# Patient Record
Sex: Male | Born: 1960 | Race: Asian | Hispanic: No | Marital: Single | Smoking: Never smoker
Health system: Southern US, Community
[De-identification: ages and names within clinical notes are randomized; demographics above are authoritative.]

---

## 2007-03-01 ENCOUNTER — Emergency Department (HOSPITAL_COMMUNITY): Admission: EM | Admit: 2007-03-01 | Discharge: 2007-03-01 | Payer: Self-pay | Admitting: Emergency Medicine

## 2008-06-08 ENCOUNTER — Ambulatory Visit (HOSPITAL_COMMUNITY): Admission: RE | Admit: 2008-06-08 | Discharge: 2008-06-08 | Payer: Self-pay | Admitting: Neurology

## 2008-09-19 ENCOUNTER — Ambulatory Visit (HOSPITAL_COMMUNITY): Admission: RE | Admit: 2008-09-19 | Discharge: 2008-09-19 | Payer: Self-pay | Admitting: Orthopedic Surgery

## 2009-03-19 ENCOUNTER — Emergency Department (HOSPITAL_COMMUNITY): Admission: EM | Admit: 2009-03-19 | Discharge: 2009-03-19 | Payer: Self-pay | Admitting: Emergency Medicine

## 2010-06-20 ENCOUNTER — Ambulatory Visit (HOSPITAL_COMMUNITY): Admission: RE | Admit: 2010-06-20 | Discharge: 2010-06-20 | Payer: Self-pay | Admitting: Neurology

## 2010-11-28 ENCOUNTER — Inpatient Hospital Stay (INDEPENDENT_AMBULATORY_CARE_PROVIDER_SITE_OTHER)
Admission: RE | Admit: 2010-11-28 | Discharge: 2010-11-28 | Disposition: A | Discharge status: Home / Self Care | Payer: Self-pay | Source: Ambulatory Visit | Attending: Emergency Medicine | Admitting: Emergency Medicine

## 2010-11-28 DIAGNOSIS — J309 Allergic rhinitis, unspecified: Secondary | ICD-10-CM

## 2010-11-28 DIAGNOSIS — H109 Unspecified conjunctivitis: Secondary | ICD-10-CM

## 2011-12-11 ENCOUNTER — Emergency Department (HOSPITAL_COMMUNITY)
Admission: EM | Admit: 2011-12-11 | Discharge: 2011-12-11 | Disposition: A | Discharge status: Home / Self Care | Payer: 59 | Source: Home / Self Care | Attending: Emergency Medicine | Admitting: Emergency Medicine

## 2011-12-11 ENCOUNTER — Encounter (HOSPITAL_COMMUNITY): Payer: Self-pay

## 2011-12-11 DIAGNOSIS — J309 Allergic rhinitis, unspecified: Secondary | ICD-10-CM

## 2011-12-11 DIAGNOSIS — I10 Essential (primary) hypertension: Secondary | ICD-10-CM

## 2011-12-11 DIAGNOSIS — J019 Acute sinusitis, unspecified: Secondary | ICD-10-CM

## 2011-12-11 MED ORDER — OLOPATADINE HCL 0.2 % OP SOLN: 1.0000 [drp] | Freq: Every day | OPHTHALMIC | Status: AC

## 2011-12-11 MED ORDER — METHYLPREDNISOLONE ACETATE 80 MG/ML IJ SUSP
INTRAMUSCULAR | Status: AC
  Filled 2011-12-11: qty 1

## 2011-12-11 MED ORDER — FEXOFENADINE HCL 180 MG PO TABS: 180.0000 mg | ORAL_TABLET | Freq: Every day | ORAL | Status: AC

## 2011-12-11 MED ORDER — PREDNISONE 5 MG PO KIT: 1.0000 | PACK | Freq: Every day | ORAL | Status: DC

## 2011-12-11 MED ORDER — FLUTICASONE PROPIONATE 50 MCG/ACT NA SUSP: 2.0000 | Freq: Every day | NASAL | Status: DC

## 2011-12-11 MED ORDER — METHYLPREDNISOLONE ACETATE 80 MG/ML IJ SUSP
80.0000 mg | Freq: Once | INTRAMUSCULAR | Status: AC
  Administered 2011-12-11: 80 mg via INTRAMUSCULAR

## 2011-12-11 MED ORDER — AMOXICILLIN-POT CLAVULANATE 875-125 MG PO TABS: 1.0000 | ORAL_TABLET | Freq: Two times a day (BID) | ORAL | Status: AC

## 2011-12-11 NOTE — ED Notes (Signed)
Reports he has been having pain and pressure facial area for couple of days; green tinted nasal secretions; had a rx for antibiotic from his MD in [place] written earlier this year, but did not start it until 2-3 days ago for his sympt that were not relieved by OTC medications

## 2011-12-11 NOTE — ED Provider Notes (Signed)
Chief Complaint  Patient presents with  . 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 This going on for years, ever since he came to [place]. It tends to occur in the spring time. He describes nasal congestion, drainage, sneezing, yellowish, bloody drainage, headache, sinus pressure, and postnasal drip. He has some fullness in his ears and low-grade fever of 99.5. His eyes have been red, itching, and watering. He's tried Claritin, Benadryl, amoxicillin without relief.  Review of Systems:  Other than noted above, the patient denies any of the following symptoms. Systemic:  No fever, chills, sweats, fatigue, myalgias, headache, or anorexia. Eye:  No redness, itching, watering, pain or drainage. ENT:  No earache, ear congestion, nasal congestion, sneezing, nasal itching, rhinorrhea, sinus pressure, sinus pain, post nasal drip, or sore throat. Lungs:  No cough, sputum production, wheezing, shortness of breath, or chest pain. Skin:  No rash or itching.  PMFSH:  Past medical history, family history, social history, meds, and allergies were reviewed.  No history of asthma. No use of tobacco.  Physical Exam:   Vital signs:  BP 164/98  Pulse 84  Temp(Src) 97.7 F (36.5 C) (Oral)  Resp 18  SpO2 100% General:  Alert, in no distress. Eye:  No conjunctival injection or drainage. Lids were normal. ENT:  TMs and canals were normal, without erythema or inflammation.  Nasal mucosa was congested, pale and boggy with clear drainage.  Mucous membranes were moist.  Pharynx was clear, without exudate or drainage.  There were no oral ulcerations or lesions. Neck:  Supple, no adenopathy, tenderness or mass. Lungs:  No respiratory distress.  Lungs were clear to auscultation, without wheezes, rales or rhonchi.  Breath sounds were clear and equal bilaterally. Heart:  Regular rhythm, without gallops, murmers or rubs. Skin:   Clear, warm, and dry, without rash or lesions.  Assessment:  The primary encounter diagnosis was Allergic rhinitis. Diagnoses of Acute sinusitis and Essential hypertension were also pertinent to this visit.  Plan:   1.  The following meds were prescribed:   New Prescriptions   AMOXICILLIN-CLAVULANATE (AUGMENTIN) 875-125 MG PER TABLET    Take 1 tablet by mouth 2 (two) times daily.   FEXOFENADINE (ALLEGRA) 180 MG TABLET    Take 1 tablet (180 mg total) by mouth daily.   FLUTICASONE (FLONASE) 50 MCG/ACT NASAL SPRAY    Place 2 sprays into the nose daily.   OLOPATADINE HCL (PATADAY) 0.2 % SOLN    Apply 1 drop to eye daily.   PREDNISONE 5 MG KIT    Take 1 kit (5 mg total) by mouth daily after breakfast. Prednisone 5 mg 6 day dosepack.  Take as directed.   2.  The patient was instructed in symptomatic care and handouts were given. 3.  The patient was told to return if becoming worse in any way, if no better in 3 or 4 days, and given some red flag symptoms that would indicate earlier return.  Follow up:  The patient was told to follow up Dr. Patrice Paradise within the next 2 weeks.    Reuben Likes, MD 12/11/11 2221

## 2011-12-11 NOTE — ED Notes (Signed)
Review of last [facility]?ED visit , bp was also elevated

## 2011-12-11 NOTE — Discharge Instructions (Signed)
Your blood pressure was elevated today.  Be sure to follow up on this within 2 weeks.  Read material below to find out some ways to control your blood pressure.   Blood pressure over the ideal can put you at higher risk for stroke, heart disease, and kidney failure.  For this reason, it's important to try to get your blood pressure as close as possible to the ideal.  The ideal blood pressure is 120/80.  Blood pressures from 120-139 systolic over 80-89 diastolic are labeled as "prehypertension."  This means you are at higher risk of developing hypertension in the future.  Blood pressures in this range are not treated with medication, but lifestyle changes are recommended to prevent progression to hypertension.  Blood pressures of 140 and above systolic over 90 and above diastolic are classified as hypertension and are treated with medications.  Lifestyle changes which can benefit both prehypertension and hypertension include the following:   Salt and sodium restriction.  Weight loss.  Regular exercise.  Avoidance of tobacco.  Avoidance of excess alcohol.  The "D.A.S.H" diet.   People with hypertension and prehypertension should limit their salt intake to less than 1500 mg daily.  Reading the nutrition information on the label of many prepared foods can give you an idea of how much sodium you're consuming at each meal.  Remember that the most important number on the nutrition information is the serving size.  It may be smaller than you think.  Try to avoid adding extra salt at the table.  You may add small amounts of salt while cooking.  Remember that salt is an acquired taste and you may get used to a using a whole lot less salt than you are using now.  Using less salt lets the food's natural flavors come through.  You might want to consider using salt substitutes, potassium chloride, pepper, or blends of herbs and spices to enhance the flavor of your food.  Foods that contain the most salt  include: processed meats (like ham, bacon, lunch meat, sausage, hot dogs, and breakfast meat), chips, pretzels, salted nuts, soups, salty snacks, canned foods, junk food, fast food, restaurant food, mustard, pickles, pizza, popcorn, soy sauce, and worcestershire sauce--quite a list!  You might ask, "Is there anything I can eat?"  The answer is, "yes."  Fruits and vegetables are usually low in salt.  Fresh is better than frozen which is better than canned.  If you have canned vegetables, you can cut down on the salt content by rinsing them in tap water 3 times before cooking.     Weight loss is the second thing you can do to lower your blood pressure.  Getting to and maintaining ideal weight will often normalize your blood pressure and allow you to avoid medications, entirely, cut way down on your dosage of medications, or allow to wean off your meds.  (Note, this should only be done under the supervision of your primary care doctor.)  Of course, weight loss takes time and you may need to be on medication in the meantime.  You shoot for a body mass index of 20-25.  When you go to the urgent care or to your primary care doctor, they should calculate your BMI.  If you don't know what it is, ask.  You can calculate your BMI with the following formula:  Weight in pounds x 703/ (height in inches) x (height in inches).  There are many good diets out there: Weight Watchers and  the D.A.S.H. Diet are the best, but often, just modifying a few factors can be helpful:  Don't skip meals, don't eat out, and keeping a food diary.  I do not recommend fad diets or diet pills which often raise blood pressure.    Everyone should get regular exercise, but this is particularly important for people with high blood pressure.  Just about any exercise is good.  The only exercise which may be harmful is lifting extreme heavy weights.  I recommend moderate exercise such as walking for 30 minutes 5 days a week.  Going to the gym for a 50  minute workout 3 times a week is also good.  This amounts to 150 minutes of exercise weekly.   Anyone with high blood pressure should avoid any use of tobacco.  Tobacco use does not elevate blood pressure, but it increases the risk of heart disease and stroke.  If you are interested in quitting, discuss with your doctor how to quit.  If you are not interested in quitting, ask yourself, "What would my life be like in 10 years if I continue to smoke?"  "How will I know when it is time to quit?"  "How would my life be better if I were to quit."   Excess alcohol intake can raise the blood pressure.  The safe alcohol intake is 2 drinks or less per day for men and 1 drink per day or less for women.   There is a very good diet which I recommend that has been designed for people with blood pressure called the D.A.S.H. Diet (dietary approaches to stop hypertension).  It consists of fruits, vegetables, lean meats, low fat dairy, whole grains, nuts and seeds.  It is very low in salt and sodium.  It has also been found to have other beneficial health effects such as lowering cholesterol and helping lose weight.  It has been developed by the [employer] and can be downloaded from the internet without any cost. Just do a Programmer, multimedia on "D.A.S.H. Diet." or go the NIH website (GolfingPosters.tn).  There are also cookbooks and diet plans that can be gotten from [place] to help you with this diet.   Allergic Rhinitis Allergic rhinitis is when the mucous membranes in the nose respond to allergens. Allergens are particles in the air that cause your body to have an allergic reaction. This causes you to release allergic antibodies. Through a chain of events, these eventually cause you to release histamine into the blood stream (hence the use of antihistamines). Although meant to be protective to the body, it is this release that causes your discomfort, such as frequent sneezing, congestion and an itchy runny nose.    CAUSES  The pollen allergens may come from grasses, trees, and weeds. This is seasonal allergic rhinitis, or "hay fever." Other allergens cause year-round allergic rhinitis (perennial allergic rhinitis) such as house dust mite allergen, pet dander and mold spores.  SYMPTOMS   Nasal stuffiness (congestion).   Runny, itchy nose with sneezing and tearing of the eyes.   There is often an itching of the mouth, eyes and ears.  It cannot be cured, but it can be controlled with medications. DIAGNOSIS  If you are unable to determine the offending allergen, skin or blood testing may find it. TREATMENT   Avoid the allergen.   Medications and allergy shots (immunotherapy) can help.   Hay fever may often be treated with antihistamines in pill or nasal spray forms.  Antihistamines block the effects of histamine. There are over-the-counter medicines that may help with nasal congestion and swelling around the eyes. Check with your caregiver before taking or giving this medicine.  If the treatment above does not work, there are many new medications your caregiver can prescribe. Stronger medications may be used if initial measures are ineffective. Desensitizing injections can be used if medications and avoidance fails. Desensitization is when a patient is given ongoing shots until the body becomes less sensitive to the allergen. Make sure you follow up with your caregiver if problems continue. SEEK MEDICAL CARE IF:   You develop fever (more than 100.5 F (38.1 C).   You develop a cough that does not stop easily (persistent).   You have shortness of breath.   You start wheezing.   Symptoms interfere with normal daily activities.  Document Released: 04/28/2001 Document Revised: 07/23/2011 Document Reviewed: 11/07/2008 [facility] Patient Information 2012 [place], [place].Sinusitis Sinuses are air pockets within the bones of your face. The growth of bacteria within a sinus leads to infection. The infection  prevents the sinuses from draining. This infection is called sinusitis. SYMPTOMS  There will be different areas of pain depending on which sinuses have become infected.  The maxillary sinuses often produce pain beneath the eyes.   Frontal sinusitis may cause pain in the middle of the forehead and above the eyes.  Other problems (symptoms) include:  Toothaches.   Colored, pus-like (purulent) drainage from the nose.   Swelling, warmth, and tenderness over the sinus areas may be signs of infection.  TREATMENT  Sinusitis is most often determined by an exam.X-rays may be taken. If x-rays have been taken, make sure you obtain your results or find out how you are to obtain them. Your caregiver may give you medications (antibiotics). These are medications that will help kill the bacteria causing the infection. You may also be given a medication (decongestant) that helps to reduce sinus swelling.  HOME CARE INSTRUCTIONS   Only take over-the-counter or prescription medicines for pain, discomfort, or fever as directed by your caregiver.   Drink extra fluids. Fluids help thin the mucus so your sinuses can drain more easily.   Applying either moist heat or ice packs to the sinus areas may help relieve discomfort.   Use saline nasal sprays to help moisten your sinuses. The sprays can be found at your local drugstore.  SEEK IMMEDIATE MEDICAL CARE IF:  You have a fever.   You have increasing pain, severe headaches, or toothache.   You have nausea, vomiting, or drowsiness.   You develop unusual swelling around the face or trouble seeing.  MAKE SURE YOU:   Understand these instructions.   Will watch your condition.   Will get help right away if you are not doing well or get worse.  Document Released: 08/03/2005 Document Revised: 07/23/2011 Document Reviewed: 03/02/2007 [facility] Patient Information 2012 [place], [place].

## 2014-12-13 ENCOUNTER — Encounter (HOSPITAL_COMMUNITY): Payer: Self-pay | Admitting: Emergency Medicine

## 2014-12-13 ENCOUNTER — Emergency Department (HOSPITAL_COMMUNITY)
Admission: EM | Admit: 2014-12-13 | Discharge: 2014-12-13 | Disposition: A | Discharge status: Home / Self Care | Payer: 59 | Source: Home / Self Care | Attending: Emergency Medicine | Admitting: Emergency Medicine

## 2014-12-13 DIAGNOSIS — K088 Other specified disorders of teeth and supporting structures: Secondary | ICD-10-CM | POA: Diagnosis not present

## 2014-12-13 DIAGNOSIS — K0889 Other specified disorders of teeth and supporting structures: Secondary | ICD-10-CM

## 2014-12-13 MED ORDER — AMOXICILLIN 500 MG PO CAPS: 500.0000 mg | ORAL_CAPSULE | Freq: Three times a day (TID) | ORAL | Status: DC

## 2014-12-13 MED ORDER — IBUPROFEN 800 MG PO TABS: 800.0000 mg | ORAL_TABLET | Freq: Three times a day (TID) | ORAL | Status: DC | PRN

## 2014-12-13 MED ORDER — HYDROCODONE-ACETAMINOPHEN 5-325 MG PO TABS: 1.0000 | ORAL_TABLET | Freq: Four times a day (QID) | ORAL | Status: DC | PRN

## 2014-12-13 NOTE — Discharge Instructions (Signed)
You are likely developing an infection around one of your teeth. Take amoxicillin 1 pill 3 times a day for 10 days. Take ibuprofen 800 mg every 8 hours as needed for pain. Use the Norco every 4-6 hours as needed for severe pain. Do not drive while taking this medicine. Follow-up with a dentist as soon as possible for additional evaluation.

## 2014-12-13 NOTE — ED Provider Notes (Signed)
CSN: [id number]     Arrival date & time 12/13/14  0806 History   None    Chief Complaint  Patient presents with  . Dental Pain   (Consider location/radiation/quality/duration/timing/severity/associated sxs/prior Treatment) HPI  He is a 54 year old man here for evaluation of left lower jaw swelling. He states this started yesterday after eating symptoms chocolate. Developed a left lower toothache. When he woke up this morning he had swelling and tightness of the left lower jaw. He denies any fevers or difficulty swallowing. No difficulty breathing.  History reviewed. No pertinent past medical history. History reviewed. No pertinent past surgical history. History reviewed. No pertinent family history. History  Substance Use Topics  . Smoking status: Never Smoker   . Smokeless tobacco: Not on file  . Alcohol Use: No    Review of Systems As in history of present illness Allergies  Eggs or egg-derived products  Home Medications   Prior to Admission medications   Medication Sig Start Date End Date Taking? Authorizing Provider  amoxicillin (AMOXIL) 500 MG capsule Take 1 capsule (500 mg total) by mouth 3 (three) times daily. 12/13/14   Charm RingsErin J Bashir Marchetti, MD  fexofenadine (ALLEGRA) 180 MG tablet Take 1 tablet (180 mg total) by mouth daily. 12/11/11 12/10/12  Reuben Likesavid C Keller, MD  fluticasone (FLONASE) 50 MCG/ACT nasal spray Place 2 sprays into the nose daily. 12/11/11 12/10/12  Reuben Likesavid C Keller, MD  HYDROcodone-acetaminophen (NORCO) 5-325 MG per tablet Take 1 tablet by mouth every 6 (six) hours as needed for moderate pain. 12/13/14   Charm RingsErin J Jerolene Kupfer, MD  ibuprofen (ADVIL,MOTRIN) 800 MG tablet Take 1 tablet (800 mg total) by mouth every 8 (eight) hours as needed. 12/13/14   Charm RingsErin J Nabilah Davoli, MD  Olopatadine HCl (PATADAY) 0.2 % SOLN Apply 1 drop to eye daily. 12/11/11   Reuben Likesavid C Keller, MD   BP 136/86 mmHg  Pulse 81  Temp(Src) 97.9 F (36.6 C) (Oral)  Resp 16  SpO2 100% Physical Exam  Constitutional: He is  oriented to person, place, and time. He appears well-developed and well-nourished. No distress.  HENT:  Mouth/Throat: Abnormal dentition.  Mild swelling of the left lower jaw. No focal abscess seen in the mouth. He has general poor dentition.  Cardiovascular: Normal rate.   Pulmonary/Chest: Effort normal.  Neurological: He is alert and oriented to person, place, and time.    ED Course  Procedures (including critical care time) Labs Review Labs Reviewed - No data to display  Imaging Review No results found.   MDM   1. Pain, dental    Will treat for infection with amoxicillin. Ibuprofen and Norco for pain. Follow-up with a dentist as soon as possible.    Charm RingsErin J Jaaziel Peatross, MD 12/13/14 [phone]

## 2014-12-13 NOTE — ED Notes (Signed)
Pt states that he was eating some chocolate last night and he tooth on the lower right side of his mouth started hurting he states that his right side of face starting swelling.

## 2016-11-28 ENCOUNTER — Ambulatory Visit (HOSPITAL_COMMUNITY)
Admission: EM | Admit: 2016-11-28 | Discharge: 2016-11-28 | Disposition: A | Discharge status: Home / Self Care | Payer: 59 | Attending: Family Medicine | Admitting: Family Medicine

## 2016-11-28 DIAGNOSIS — H00011 Hordeolum externum right upper eyelid: Secondary | ICD-10-CM | POA: Diagnosis not present

## 2016-11-28 MED ORDER — TOBRAMYCIN 0.3 % OP SOLN: 1.0000 [drp] | OPHTHALMIC | 0 refills | Status: DC

## 2016-11-28 MED ORDER — TOBRAMYCIN 0.3 % OP SOLN: 1.0000 [drp] | OPHTHALMIC | 0 refills | Status: AC

## 2016-11-28 NOTE — ED Provider Notes (Signed)
[facility]    CSN: [id number] Arrival date & time: 11/28/16  1216     History   Chief Complaint Chief Complaint  Patient presents with  . Conjunctivitis    HPI Roberto Harris is a 56 y.o. male.   rt eye poss allergies  Patient has had swelling in his right upper lid overnight. He works as a Engineer, civil (consulting). His taken no medicine for this other than Benadryl.      No past medical history on file.  There are no active problems to display for this patient.   No past surgical history on file.     Home Medications    Prior to Admission medications   Medication Sig Start Date End Date Taking? Authorizing Provider  fexofenadine (ALLEGRA) 180 MG tablet Take 1 tablet (180 mg total) by mouth daily. 12/11/11 12/10/12  Reuben Likes, MD  fluticasone (FLONASE) 50 MCG/ACT nasal spray Place 2 sprays into the nose daily. 12/11/11 12/10/12  Reuben Likes, MD  Olopatadine HCl (PATADAY) 0.2 % SOLN Apply 1 drop to eye daily. 12/11/11   Reuben Likes, MD  tobramycin (TOBREX) 0.3 % ophthalmic solution Place 1 drop into the right eye every 4 (four) hours. 11/28/16   Elvina Sidle, MD    Family History No family history on file.  Social History Social History  Substance Use Topics  . Smoking status: Never Smoker  . Smokeless tobacco: Not on file  . Alcohol use No     Allergies   Eggs or egg-derived products   Review of Systems Review of Systems  Eyes: Positive for pain and redness.  All other systems reviewed and are negative.    Physical Exam Triage Vital Signs ED Triage Vitals [11/28/16 1343]  Enc Vitals Group     BP 125/81     Pulse Rate 76     Resp      Temp 97.9 F (36.6 C)     Temp Source Oral     SpO2 97 %     Weight      Height      Head Circumference      Peak Flow      Pain Score      Pain Loc      Pain Edu?      Excl. in GC?    No data found.   Updated Vital Signs BP 125/81 (BP Location: Right Arm)   Pulse 76   Temp 97.9 F (36.6 C)  (Oral)   SpO2 97%    Physical Exam  Constitutional: He is oriented to person, place, and time. He appears well-developed and well-nourished.  HENT:  Right Ear: External ear normal.  Left Ear: External ear normal.  Mouth/Throat: Oropharynx is clear and moist.  Eyes: Pupils are equal, round, and reactive to light.  Mild injection of the right conjunctiva Mild swelling in the midportion of the upper right lid. This is tender.  Neck: Normal range of motion. Neck supple.  Pulmonary/Chest: Effort normal.  Musculoskeletal: Normal range of motion.  Neurological: He is alert and oriented to person, place, and time.  Skin: Skin is warm and dry. No rash noted. There is erythema.  Nursing note and vitals reviewed.    UC Treatments / Results  Labs (all labs ordered are listed, but only abnormal results are displayed) Labs Reviewed - No data to display  EKG  EKG Interpretation None       Radiology No results found.  Procedures  Procedures (including critical care time)  Medications Ordered in UC Medications - No data to display   Initial Impression / Assessment and Plan / UC Course  I have reviewed the triage vital signs and the nursing notes.  Pertinent labs & imaging results that were available during my care of the patient were reviewed by me and considered in my medical decision making (see chart for details).     Final Clinical Impressions(s) / UC Diagnoses   Final diagnoses:  Hordeolum externum of right upper eyelid    New Prescriptions New Prescriptions   TOBRAMYCIN (TOBREX) 0.3 % OPHTHALMIC SOLUTION    Place 1 drop into the right eye every 4 (four) hours.     Elvina Sidle, MD 11/28/16 [phone]

## 2017-05-21 ENCOUNTER — Encounter (HOSPITAL_COMMUNITY): Payer: Self-pay | Admitting: Emergency Medicine

## 2017-05-21 ENCOUNTER — Ambulatory Visit (HOSPITAL_COMMUNITY)
Admission: EM | Admit: 2017-05-21 | Discharge: 2017-05-21 | Disposition: A | Discharge status: Home / Self Care | Payer: 59 | Attending: Physician Assistant | Admitting: Physician Assistant

## 2017-05-21 DIAGNOSIS — J00 Acute nasopharyngitis [common cold]: Secondary | ICD-10-CM | POA: Diagnosis not present

## 2017-05-21 MED ORDER — CETIRIZINE-PSEUDOEPHEDRINE ER 5-120 MG PO TB12: 1.0000 | ORAL_TABLET | Freq: Every day | ORAL | 0 refills | Status: AC

## 2017-05-21 MED ORDER — BENZONATATE 100 MG PO CAPS: 100.0000 mg | ORAL_CAPSULE | Freq: Three times a day (TID) | ORAL | 0 refills | Status: DC

## 2017-05-21 MED ORDER — FLUTICASONE PROPIONATE 50 MCG/ACT NA SUSP: 2.0000 | Freq: Every day | NASAL | 0 refills | Status: AC

## 2017-05-21 MED FILL — FLUTICASONE PROP 50 MCG SPR: 50 | 30 days supply | Qty: 16 | Fill #0

## 2017-05-21 MED FILL — BENZONATATE 100 MG CAPS: 100 | 7 days supply | Qty: 21 | Fill #0

## 2017-05-21 NOTE — ED Triage Notes (Signed)
Pt c/o cold sx onset 2-3 days associated w/fevers, nasal congestion/drainage, BAs, HAs  Taking OTC Nyquil  A&O x4... NAD... Ambulatory

## 2017-05-21 NOTE — ED Provider Notes (Signed)
[facility]    CSN: [id number] Arrival date & time: 05/21/17  1104     History   Chief Complaint Chief Complaint  Patient presents with  . URI    HPI Roberto Harris is a 56 y.o. male.   56 year old male comes in with 2-3 day history of cold symptoms. States having nasal congestion/drainage, body aches, headaches and subjective fever. Has been taking nyquil/tylenol with little relief. Does state that symptoms seems to be better, but congestion seems to continue. Mild coughing that is nonproductive. Patient state he took claritin when rhinorrhea first started, but has discontinued. Denies ear pain, eye pain. States headache is more like heaviness. Denies chest pain, shortness of breath, wheezing. No sick contact.       History reviewed. No pertinent past medical history.  There are no active problems to display for this patient.   History reviewed. No pertinent surgical history.     Home Medications    Prior to Admission medications   Medication Sig Start Date End Date Taking? Authorizing Provider  benzonatate (TESSALON) 100 MG capsule Take 1 capsule (100 mg total) by mouth every 8 (eight) hours. 05/21/17   Cathie Hoops, Amy V, PA-C  cetirizine-pseudoephedrine (ZYRTEC-D) 5-120 MG tablet Take 1 tablet by mouth daily. 05/21/17   Cathie Hoops, Amy V, PA-C  fexofenadine (ALLEGRA) 180 MG tablet Take 1 tablet (180 mg total) by mouth daily. 12/11/11 12/10/12  Reuben Likes, MD  fluticasone (FLONASE) 50 MCG/ACT nasal spray Place 2 sprays into both nostrils daily. 05/21/17   Cathie Hoops, Amy V, PA-C  Olopatadine HCl (PATADAY) 0.2 % SOLN Apply 1 drop to eye daily. 12/11/11   Reuben Likes, MD  tobramycin (TOBREX) 0.3 % ophthalmic solution Place 1 drop into the right eye every 4 (four) hours. 11/28/16   Elvina Sidle, MD    Family History History reviewed. No pertinent family history.  Social History Social History  Substance Use Topics  . Smoking status: Never Smoker  . Smokeless tobacco: Never  Used  . Alcohol use No     Allergies   Eggs or egg-derived products   Review of Systems Review of Systems  Reason unable to perform ROS: See HPI as above.     Physical Exam Triage Vital Signs ED Triage Vitals  Enc Vitals Group     BP 05/21/17 1133 138/76     Pulse Rate 05/21/17 1133 87     Resp 05/21/17 1133 20     Temp 05/21/17 1133 98 F (36.7 C)     Temp Source 05/21/17 1133 Oral     SpO2 05/21/17 1133 100 %     Weight --      Height --      Head Circumference --      Peak Flow --      Pain Score 05/21/17 1134 4     Pain Loc --      Pain Edu? --      Excl. in GC? --    No data found.   Updated Vital Signs BP 138/76 (BP Location: Left Arm)   Pulse 87   Temp 98 F (36.7 C) (Oral)   Resp 20   SpO2 100%    Physical Exam  Constitutional: He is oriented to person, place, and time. He appears well-developed and well-nourished. No distress.  HENT:  Head: Normocephalic and atraumatic.  Right Ear: External ear and ear canal normal. Tympanic membrane is erythematous. Tympanic membrane is not bulging.  Left Ear: External ear and ear canal normal. Tympanic membrane is erythematous. Tympanic membrane is not bulging.  Nose: Mucosal edema and rhinorrhea present. Right sinus exhibits no maxillary sinus tenderness and no frontal sinus tenderness. Left sinus exhibits no maxillary sinus tenderness and no frontal sinus tenderness.  Mouth/Throat: Uvula is midline, oropharynx is clear and moist and mucous membranes are normal.  Eyes: Pupils are equal, round, and reactive to light. Conjunctivae are normal.  Neck: Normal range of motion. Neck supple.  Cardiovascular: Normal rate, regular rhythm and normal heart sounds.  Exam reveals no gallop and no friction rub.   No murmur heard. Pulmonary/Chest: Effort normal and breath sounds normal. He has no decreased breath sounds. He has no wheezes. He has no rhonchi. He has no rales.  Lymphadenopathy:    He has no cervical adenopathy.    Neurological: He is alert and oriented to person, place, and time.  Skin: Skin is warm and dry.  Psychiatric: He has a normal mood and affect. His behavior is normal. Judgment normal.     UC Treatments / Results  Labs (all labs ordered are listed, but only abnormal results are displayed) Labs Reviewed - No data to display  EKG  EKG Interpretation None       Radiology No results found.  Procedures Procedures (including critical care time)  Medications Ordered in UC Medications - No data to display   Initial Impression / Assessment and Plan / UC Course  I have reviewed the triage vital signs and the nursing notes.  Pertinent labs & imaging results that were available during my care of the patient were reviewed by me and considered in my medical decision making (see chart for details).    Discussed with patient history and exam most consistent with viral URI. Symptomatic treatment as needed. Push fluids. Return precautions given.   Final Clinical Impressions(s) / UC Diagnoses   Final diagnoses:  Acute nasopharyngitis    New Prescriptions New Prescriptions   BENZONATATE (TESSALON) 100 MG CAPSULE    Take 1 capsule (100 mg total) by mouth every 8 (eight) hours.   CETIRIZINE-PSEUDOEPHEDRINE (ZYRTEC-D) 5-120 MG TABLET    Take 1 tablet by mouth daily.   FLUTICASONE (FLONASE) 50 MCG/ACT NASAL SPRAY    Place 2 sprays into both nostrils daily.      Belinda Fisher, PA-C 05/21/17 1228

## 2017-05-21 NOTE — Discharge Instructions (Signed)
Tessalon for cough. Start flonase, zyrtec-D for nasal congestion. You can use over the counter nasal saline rinse such as neti pot for nasal congestion. Keep hydrated, your urine should be clear to pale yellow in color. Tylenol/motrin for fever and pain. Monitor for any worsening of symptoms, chest pain, shortness of breath, wheezing, swelling of the throat, follow up for reevaluation.  ° °

## 2017-10-05 ENCOUNTER — Encounter (HOSPITAL_COMMUNITY): Payer: Self-pay

## 2017-10-05 ENCOUNTER — Ambulatory Visit (HOSPITAL_COMMUNITY)
Admission: EM | Admit: 2017-10-05 | Discharge: 2017-10-05 | Disposition: A | Discharge status: Home / Self Care | Payer: 59 | Attending: Internal Medicine | Admitting: Internal Medicine

## 2017-10-05 DIAGNOSIS — R52 Pain, unspecified: Secondary | ICD-10-CM

## 2017-10-05 DIAGNOSIS — R05 Cough: Secondary | ICD-10-CM

## 2017-10-05 DIAGNOSIS — R69 Illness, unspecified: Principal | ICD-10-CM

## 2017-10-05 DIAGNOSIS — J111 Influenza due to unidentified influenza virus with other respiratory manifestations: Secondary | ICD-10-CM

## 2017-10-05 DIAGNOSIS — R0981 Nasal congestion: Secondary | ICD-10-CM | POA: Diagnosis not present

## 2017-10-05 MED ORDER — OSELTAMIVIR PHOSPHATE 75 MG PO CAPS: 75.0000 mg | ORAL_CAPSULE | Freq: Two times a day (BID) | ORAL | 0 refills | Status: AC

## 2017-10-05 MED ORDER — BENZONATATE 200 MG PO CAPS: 200.0000 mg | ORAL_CAPSULE | Freq: Three times a day (TID) | ORAL | 0 refills | Status: AC | PRN

## 2017-10-05 MED ORDER — PREDNISONE 50 MG PO TABS: 50.0000 mg | ORAL_TABLET | Freq: Every day | ORAL | 0 refills | Status: AC

## 2017-10-05 MED FILL — predniSONE 50 MG TABS: 50 | 3 days supply | Qty: 3 | Fill #0

## 2017-10-05 MED FILL — BENZONATATE 200 MG CAPSULE: 200 | 10 days supply | Qty: 30 | Fill #0

## 2017-10-05 MED FILL — OSELTAMIVIR PHOSPHATE 75 MG: 75 | 5 days supply | Qty: 10 | Fill #0

## 2017-10-05 NOTE — Discharge Instructions (Addendum)
Anticipate gradual improvement in cough, well being, achiness, over the next several days.  Cough may take a couple weeks to subside.  Note for work, return to work 2/21.  Push fluids and rest.  Cover coughs and wash hands frequently.  Prescriptions for oseltamivir (for flu), prednisone for cough, and benzonatate for cough, were sent to the pharmacy.  Recheck for new fever >100.5, increasing phlegm production/nasal discharge, or if not starting to improve in a few days.

## 2017-10-05 NOTE — ED Provider Notes (Signed)
[facility]    CSN: [id number] Arrival date & time: 10/05/17  1019     History   Chief Complaint Chief Complaint  Patient presents with  . Influenza    HPI Roberto Harris is a 57 y.o. male.   He presents today with onset 2 days ago of cough, malaise, widespread achiness.  Little bit of fever, some chills.  Did have a flu shot.  Had loose stool x2 this morning.  Not vomiting.  Little bit of clear rhinorrhea.  No sore throat.  Feels terrible.    HPI  History reviewed. No pertinent past medical history.  No past surgical history on file.     Home Medications    Prior to Admission medications   Medication Sig Start Date End Date Taking? Authorizing Provider  fexofenadine (ALLEGRA) 180 MG tablet Take 1 tablet (180 mg total) by mouth daily. 12/11/11 10/05/17 Yes Reuben LikesKeller, David C, MD  benzonatate (TESSALON) 200 MG capsule Take 1 capsule (200 mg total) by mouth 3 (three) times daily as needed for cough. 10/05/17   Isa RankinMurray, Apolinar Bero Wilson, MD  cetirizine-pseudoephedrine (ZYRTEC-D) 5-120 MG tablet Take 1 tablet by mouth daily. 05/21/17   Cathie HoopsYu, Amy V, PA-C  fluticasone (FLONASE) 50 MCG/ACT nasal spray Place 2 sprays into both nostrils daily. 05/21/17   Cathie HoopsYu, Amy V, PA-C  Olopatadine HCl (PATADAY) 0.2 % SOLN Apply 1 drop to eye daily. 12/11/11   Reuben LikesKeller, David C, MD  oseltamivir (TAMIFLU) 75 MG capsule Take 1 capsule (75 mg total) by mouth every 12 (twelve) hours. 10/05/17   Isa RankinMurray, Kimya Mccahill Wilson, MD  predniSONE (DELTASONE) 50 MG tablet Take 1 tablet (50 mg total) by mouth daily. 10/05/17   Isa RankinMurray, Jordan Pardini Wilson, MD  tobramycin (TOBREX) 0.3 % ophthalmic solution Place 1 drop into the right eye every 4 (four) hours. 11/28/16   Elvina SidleLauenstein, Kurt, MD    Family History No family history on file.  Social History Social History   Tobacco Use  . Smoking status: Never Smoker  . Smokeless tobacco: Never Used  Substance Use Topics  . Alcohol use: No  . Drug use: No     Allergies   Eggs or  egg-derived products   Review of Systems Review of Systems  All other systems reviewed and are negative.    Physical Exam Triage Vital Signs ED Triage Vitals  Enc Vitals Group     BP 10/05/17 1057 (!) 153/88     Pulse Rate 10/05/17 1057 92     Resp 10/05/17 1057 18     Temp 10/05/17 1057 (!) 97.4 F (36.3 C)     Temp Source 10/05/17 1057 Oral     SpO2 10/05/17 1057 99 %     Weight 10/05/17 1102 145 lb (65.8 kg)     Height --      Pain Score --      Pain Loc --    Updated Vital Signs BP (!) 153/88 (BP Location: Left Arm) Comment: Notified [place], pt sts that he has been taking cough meds  Pulse 92   Temp (!) 97.4 F (36.3 C) (Oral)   Resp 18   Wt 145 lb (65.8 kg)   SpO2 99%   Physical Exam  Constitutional: He is oriented to person, place, and time.  Alert, nicely groomed Sitting up on the end of the exam table, looks ill but not toxic  HENT:  Head: Atraumatic.  Bilateral TMs are flushed pink, mildly dull Marked nasal congestion bilaterally Throat  is injected  Eyes:  Conjugate gaze, bilateral conjunctivae mildly injected, no drainage  Neck: Neck supple.  Cardiovascular: Normal rate and regular rhythm.  Pulmonary/Chest: No respiratory distress. He has no wheezes. He has no rales.  Abdominal: He exhibits no distension.  Musculoskeletal: Normal range of motion.  Neurological: He is alert and oriented to person, place, and time.  Skin: Skin is warm and dry.  No cyanosis  Nursing note and vitals reviewed.    UC Treatments / Results   Procedures Procedures (including critical care time) None today  Final Clinical Impressions(s) / UC Diagnoses   Final diagnoses:  Influenza-like illness   Anticipate gradual improvement in cough, well being, achiness, over the next several days.  Cough may take a couple weeks to subside.  Note for work, return to work 2/21.  Push fluids and rest.  Cover coughs and wash hands frequently.  Prescriptions for oseltamivir (for  flu), prednisone for cough, and benzonatate for cough, were sent to the pharmacy.  Recheck for new fever >100.5, increasing phlegm production/nasal discharge, or if not starting to improve in a few days.     ED Discharge Orders        Ordered    oseltamivir (TAMIFLU) 75 MG capsule  Every 12 hours     10/05/17 1140    predniSONE (DELTASONE) 50 MG tablet  Daily     10/05/17 1140    benzonatate (TESSALON) 200 MG capsule  3 times daily PRN     10/05/17 1140        Isa Rankin, MD 10/10/17 [phone]

## 2017-10-05 NOTE — ED Triage Notes (Signed)
Pt presents with body aches, fever, cough and diarrhea x 2 days

## 2019-09-16 ENCOUNTER — Emergency Department (HOSPITAL_COMMUNITY): Payer: 59

## 2019-09-16 ENCOUNTER — Ambulatory Visit (HOSPITAL_COMMUNITY)
Admission: EM | Admit: 2019-09-16 | Discharge: 2019-09-16 | Disposition: A | Discharge status: Home / Self Care | Payer: 59 | Source: Home / Self Care

## 2019-09-16 ENCOUNTER — Emergency Department (HOSPITAL_COMMUNITY)
Admission: EM | Admit: 2019-09-16 | Discharge: 2019-09-16 | Disposition: A | Discharge status: Home / Self Care | Payer: 59 | Attending: Emergency Medicine | Admitting: Emergency Medicine

## 2019-09-16 ENCOUNTER — Encounter (HOSPITAL_COMMUNITY): Payer: Self-pay | Admitting: Emergency Medicine

## 2019-09-16 ENCOUNTER — Encounter (HOSPITAL_COMMUNITY): Payer: Self-pay

## 2019-09-16 DIAGNOSIS — R0789 Other chest pain: Secondary | ICD-10-CM | POA: Diagnosis not present

## 2019-09-16 DIAGNOSIS — Z79899 Other long term (current) drug therapy: Secondary | ICD-10-CM | POA: Diagnosis not present

## 2019-09-16 DIAGNOSIS — R079 Chest pain, unspecified: Secondary | ICD-10-CM

## 2019-09-16 DIAGNOSIS — F419 Anxiety disorder, unspecified: Secondary | ICD-10-CM | POA: Diagnosis not present

## 2019-09-16 LAB — HEPATIC FUNCTION PANEL
ALT: 26 U/L (ref 0–44)
AST: 26 U/L (ref 15–41)
Albumin: 4.5 g/dL (ref 3.5–5.0)
Alkaline Phosphatase: 72 U/L (ref 38–126)
Bilirubin, Direct: <0.1 mg/dL (ref 0.0–0.2)
Total Bilirubin: 0.5 mg/dL (ref 0.3–1.2)
Total Protein: 7.7 g/dL (ref 6.5–8.1)

## 2019-09-16 LAB — LIPASE, BLOOD: Lipase: 38 U/L (ref 11–51)

## 2019-09-16 LAB — BASIC METABOLIC PANEL
Anion gap: 11 (ref 5–15)
BUN: 11 mg/dL (ref 6–20)
CO2: 27 mmol/L (ref 22–32)
Calcium: 9.6 mg/dL (ref 8.9–10.3)
Chloride: 103 mmol/L (ref 98–111)
Creatinine, Ser: 0.89 mg/dL (ref 0.61–1.24)
GFR calc Af Amer: >60 mL/min (ref >60)
GFR calc non Af Amer: >60 mL/min (ref >60)
Glucose, Bld: 129 mg/dL — ABNORMAL HIGH (ref 70–99)
Potassium: 3.5 mmol/L (ref 3.5–5.1)
Sodium: 141 mmol/L (ref 135–145)

## 2019-09-16 LAB — CBC
HCT: 44.6 % (ref 39.0–52.0)
Hemoglobin: 15.1 g/dL (ref 13.0–17.0)
MCH: 30.4 pg (ref 26.0–34.0)
MCHC: 33.9 g/dL (ref 30.0–36.0)
MCV: 89.9 fL (ref 80.0–100.0)
Platelets: 265 10*3/uL (ref 150–400)
RBC: 4.96 MIL/uL (ref 4.22–5.81)
RDW: 12.1 % (ref 11.5–15.5)
WBC: 5.9 10*3/uL (ref 4.0–10.5)
nRBC: 0 % (ref 0.0–0.2)

## 2019-09-16 LAB — TROPONIN I (HIGH SENSITIVITY)
Troponin I (High Sensitivity): 3 ng/L (ref <18)
Troponin I (High Sensitivity): 2 ng/L (ref <18)

## 2019-09-16 LAB — D-DIMER, QUANTITATIVE: D-Dimer, Quant: 0.62 ug/mL-FEU — ABNORMAL HIGH (ref 0.00–0.50)

## 2019-09-16 MED ORDER — HYDROXYZINE HCL 10 MG PO TABS: 10.0000 mg | ORAL_TABLET | Freq: Four times a day (QID) | ORAL | 0 refills | Status: AC

## 2019-09-16 MED ORDER — LIDOCAINE VISCOUS HCL 2 % MT SOLN
15.0000 mL | Freq: Once | OROMUCOSAL | Status: AC
  Administered 2019-09-16: 15 mL via ORAL
  Filled 2019-09-16: qty 15

## 2019-09-16 MED ORDER — ASPIRIN 81 MG PO CHEW
324.0000 mg | CHEWABLE_TABLET | Freq: Once | ORAL | Status: AC
  Administered 2019-09-16: 324 mg via ORAL
  Filled 2019-09-16: qty 4

## 2019-09-16 MED ORDER — FAMOTIDINE 20 MG PO TABS: 20.0000 mg | ORAL_TABLET | Freq: Two times a day (BID) | ORAL | 0 refills | Status: AC

## 2019-09-16 MED ORDER — SODIUM CHLORIDE 0.9% FLUSH: 3.0000 mL | Freq: Once | INTRAVENOUS | Status: DC

## 2019-09-16 MED ORDER — ALUM & MAG HYDROXIDE-SIMETH 200-200-20 MG/5ML PO SUSP
30.0000 mL | Freq: Once | ORAL | Status: AC
  Administered 2019-09-16: 30 mL via ORAL
  Filled 2019-09-16: qty 30

## 2019-09-16 MED ORDER — IOHEXOL 350 MG/ML SOLN
80.0000 mL | Freq: Once | INTRAVENOUS | Status: AC | PRN
  Administered 2019-09-16: 52 mL via INTRAVENOUS

## 2019-09-16 NOTE — ED Notes (Signed)
Pt ambulated to the restroom.

## 2019-09-16 NOTE — Discharge Instructions (Signed)
You were given a prescription for Pepcid to help with your chest pain.  You are also given a prescription for hydroxyzine to help with anxiety.  Make sure that you do not drive, work, drink alcohol or operate machinery after taking this medication as it can make you very sleepy.  Please make an appointment to follow-up with your regular doctor in the next 5 to 7 days for reevaluation return to the ED for any new or worsening symptoms in the meantime.

## 2019-09-16 NOTE — Discharge Instructions (Signed)
Please go to the Emergency department to be further evaluated for your chest pain.

## 2019-09-16 NOTE — ED Provider Notes (Signed)
At shift change, care assumed from Nuala Alpha, [place] with plan to f/u on lab work. See his note for full H&P.   Per his note, " Roberto Harris is a 59 y.o. male presents today from urgent care for chest pain.  Patient reports that around 7 PM yesterday he developed a sharp/tight sensation to his lower central chest.  He reports pain was initially mild and has remained constant since onset it worsened shortly after onset last night radiating up towards his neck.  He reports he had multiple episodes of belching when pain was at its greatest.  He reports that he took his Prilosec and had moderate amount of relief with this.  He reports that shortly after pain returned but it was a more of a tight sensation he then took one of his wife's Xanax with some relief of the tightness.  Pain has continued today and is no longer radiating up to his neck, throughout the day pain has remained in his central chest.  He was seen at the urgent care and had requested that troponin to be drawn so they sent him to this ER for evaluation.  Of note patient reports increased stress over the last 1 week since his brother who lives of the country passed away from possible lung cancer.  Patient denies any recent illness, fever/chills, headache, vision changes, neck stiffness, cough/shortness of breath, abdominal pain, nausea/vomiting, diaphoresis, extremity swelling/color change, numbness/tingling, weakness or any additional concerns. "  Physical Exam  BP (!) 144/98 (BP Location: Right Arm)   Pulse 89   Temp 98.2 F (36.8 C) (Oral)   Resp 16   SpO2 100%   Physical Exam Constitutional:      General: He is not in acute distress.    Appearance: He is well-developed.  Eyes:     Conjunctiva/sclera: Conjunctivae normal.  Cardiovascular:     Rate and Rhythm: Normal rate and regular rhythm.  Pulmonary:     Effort: Pulmonary effort is normal.     Breath sounds: Normal breath sounds. No decreased breath sounds, wheezing,  rhonchi or rales.  Skin:    General: Skin is warm and dry.  Neurological:     Mental Status: He is alert and oriented to person, place, and time.       ED Course/Procedures     Procedures Results for orders placed or performed during the hospital encounter of 33/82/50  Basic metabolic panel  Result Value Ref Range   Sodium 141 135 - 145 mmol/L   Potassium 3.5 3.5 - 5.1 mmol/L   Chloride 103 98 - 111 mmol/L   CO2 27 22 - 32 mmol/L   Glucose, Bld 129 (H) 70 - 99 mg/dL   BUN 11 6 - 20 mg/dL   Creatinine, Ser 0.89 0.61 - 1.24 mg/dL   Calcium 9.6 8.9 - 10.3 mg/dL   GFR calc non Af Amer >60 >60 mL/min   GFR calc Af Amer >60 >60 mL/min   Anion gap 11 5 - 15  CBC  Result Value Ref Range   WBC 5.9 4.0 - 10.5 K/uL   RBC 4.96 4.22 - 5.81 MIL/uL   Hemoglobin 15.1 13.0 - 17.0 g/dL   HCT 44.6 39.0 - 52.0 %   MCV 89.9 80.0 - 100.0 fL   MCH 30.4 26.0 - 34.0 pg   MCHC 33.9 30.0 - 36.0 g/dL   RDW 12.1 11.5 - 15.5 %   Platelets 265 150 - 400 K/uL   nRBC 0.0  0.0 - 0.2 %  Lipase, blood  Result Value Ref Range   Lipase 38 11 - 51 U/L  Hepatic function panel  Result Value Ref Range   Total Protein 7.7 6.5 - 8.1 g/dL   Albumin 4.5 3.5 - 5.0 g/dL   AST 26 15 - 41 U/L   ALT 26 0 - 44 U/L   Alkaline Phosphatase 72 38 - 126 U/L   Total Bilirubin 0.5 0.3 - 1.2 mg/dL   Bilirubin, Direct <9.5 0.0 - 0.2 mg/dL   Indirect Bilirubin NOT CALCULATED 0.3 - 0.9 mg/dL  D-dimer, quantitative (not at [facility])  Result Value Ref Range   D-Dimer, Quant 0.62 (H) 0.00 - 0.50 ug/mL-FEU  Troponin I (High Sensitivity)  Result Value Ref Range   Troponin I (High Sensitivity) 3 <18 ng/L  Troponin I (High Sensitivity)  Result Value Ref Range   Troponin I (High Sensitivity) 2 <18 ng/L   DG Chest 2 View  Result Date: 09/16/2019 CLINICAL DATA:  Chest pain EXAM: CHEST - 2 VIEW COMPARISON:  None. FINDINGS: The heart size and mediastinal contours are within normal limits. No focal airspace consolidation, pleural  effusion, or pneumothorax. Incidental note of two small probable calcified granulomas in the left lung base. The visualized skeletal structures are unremarkable. IMPRESSION: No active cardiopulmonary disease. Electronically Signed   By: Duanne Guess D.O.   On: 09/16/2019 15:14   CT Angio Chest PE W and/or Wo Contrast  Result Date: 09/16/2019 CLINICAL DATA:  Chest tightness. EXAM: CT ANGIOGRAPHY CHEST WITH CONTRAST TECHNIQUE: Multidetector CT imaging of the chest was performed using the standard protocol during bolus administration of intravenous contrast. Multiplanar CT image reconstructions and MIPs were obtained to evaluate the vascular anatomy. CONTRAST:  36mL OMNIPAQUE IOHEXOL 350 MG/ML SOLN COMPARISON:  None. FINDINGS: Cardiovascular: Satisfactory opacification of the pulmonary arteries to the segmental level. No evidence of pulmonary embolism. Normal heart size. No pericardial effusion. Mediastinum/Nodes: No enlarged mediastinal, hilar, or axillary lymph nodes. Thyroid gland, trachea, and esophagus demonstrate no significant findings. Lungs/Pleura: Lungs are clear. No pleural effusion or pneumothorax. Upper Abdomen: Multiple subcentimeter gallstones are seen within the lumen of an otherwise normal-appearing gallbladder. Musculoskeletal: No chest wall abnormality. No acute or significant osseous findings. Review of the MIP images confirms the above findings. IMPRESSION: 1. No CT evidence of pulmonary embolism. 2. Cholelithiasis. Electronically Signed   By: Aram Candela M.D.   On: 09/16/2019 17:16    EKG Interpretation  Date/Time:  Saturday September 16 2019 13:43:32 EST Ventricular Rate:  100 PR Interval:  156 QRS Duration: 78 QT Interval:  344 QTC Calculation: 443 R Axis:   77 Text Interpretation: Normal sinus rhythm Normal ECG Confirmed by Margarita Grizzle [phone]) on 09/16/2019 2:40:37 PM      4:00 PM Cardiac monitoring reveals NSR, HR 90, (Rate & rhythm), as reviewed and interpreted by  me. Cardiac monitoring was ordered due to chest pain and to monitor patient for dysrhythmia.     MDM   Briefly, 59 y/o M presenting with CP starting yesterday. Constant in nature. Improved with prilosec and xanax. Pain nonexertional. Sounds atypical in nature. At shift change, pt pending delta trop, ddimer and reassessment. Anticipate d/c home if pts w/u is reassuring. Heart score 3.   VS reassuring. Exam reassuring.   Reviewed w/u  Cbc, bmp, liver enzymes, and lipase all reassuring Initial trop neg, delta trop neg ddimer elevated, will proceed with CTA chest.   EKG with NSR  CXR wnl  CTA chest  w/o evidence for PE, cholelithiasis noted.  Rechecked pt. After GI cocktail, his sxs somewhat improved.  He is in no acute distress.  He has no evidence of ACS today.  No evidence of PE.  Doubt dissection.  He does not appear to have an emergent etiology of symptoms at this time that would require further work-up or admission.  Discussed work-up and plan for follow-up with PCP.  He is also requesting some Ativan.  I stated I would give him Rx for hydroxyzine for home.  Also gave Rx for Pepcid.  Advised on specific return precautions.  He voiced understanding the plan and reasons to return.  All questions answered.  Patient stable for discharge.        Rayne Du 09/17/19 0010    Eber Hong, MD 09/17/19 2144

## 2019-09-16 NOTE — ED Triage Notes (Signed)
Pt. Stated, I started having chest pain last night and its continued through today. It goes up into my neck and up into my face.

## 2019-09-16 NOTE — ED Notes (Signed)
SKin W/D.  Denies any radiation of pain, n/v.  EKG shown to provider per Roselind Rily, CMA.

## 2019-09-16 NOTE — ED Provider Notes (Signed)
[facility]    CSN: [id number] Arrival date & time: 09/16/19  1154      History   Chief Complaint Chief Complaint  Patient presents with  . Chest Pain    HPI Roberto Harris is a 59 y.o. male.   Patient reports to urgent care today for chest pain and tightness since yesterday evening. He reports sharp central chest pain that is worse with breathing. He also endorses feeling chest tightness that makes it feel difficult to breath. He reports this tightness moved into his neck on both sides and face. He states he took an old prescription of xanax that he had and this resolved the tightness in his neck. However, the sharp chest pain continues. He denies nausea, diaphoresis.   He reports a lot of recent stress from a death in the family and being very emotional. 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 He is very aware of the symptoms of heart attack. He reported today with hopes of a troponin draw to ensure this is not his heart.      History reviewed. No pertinent past medical history.  There are no problems to display for this patient.   History reviewed. No pertinent surgical history.     Home Medications    Prior to Admission medications   Medication Sig Start Date End Date Taking? Authorizing Provider  amLODipine (NORVASC) 5 MG tablet Take 5 mg by mouth daily.   Yes [provider]  losartan (COZAAR) 25 MG tablet Take 25 mg by mouth daily.   Yes [provider]  benzonatate (TESSALON) 200 MG capsule Take 1 capsule (200 mg total) by mouth 3 (three) times daily as needed for cough. 10/05/17   Isa Rankin, MD  cetirizine-pseudoephedrine (ZYRTEC-D) 5-120 MG tablet Take 1 tablet by mouth daily. 05/21/17   Cathie Hoops, Amy V, PA-C  fexofenadine (ALLEGRA) 180 MG tablet Take 1 tablet (180 mg total) by mouth daily. 12/11/11 10/05/17  Reuben Likes, MD  fluticasone (FLONASE) 50 MCG/ACT nasal spray Place 2 sprays into both nostrils daily.  05/21/17   Cathie Hoops, Amy V, PA-C  Olopatadine HCl (PATADAY) 0.2 % SOLN Apply 1 drop to eye daily. 12/11/11   Reuben Likes, MD  oseltamivir (TAMIFLU) 75 MG capsule Take 1 capsule (75 mg total) by mouth every 12 (twelve) hours. 10/05/17   Isa Rankin, MD  predniSONE (DELTASONE) 50 MG tablet Take 1 tablet (50 mg total) by mouth daily. 10/05/17   Isa Rankin, MD  tobramycin (TOBREX) 0.3 % ophthalmic solution Place 1 drop into the right eye every 4 (four) hours. 11/28/16   Elvina Sidle, MD    Family History History reviewed. No pertinent family history.  Social History Social History   Tobacco Use  . Smoking status: Never Smoker  . Smokeless tobacco: Never Used  Substance Use Topics  . Alcohol use: No  . Drug use: No     Allergies   Eggs or egg-derived products   Review of Systems Review of Systems  Constitutional: Negative for chills and fever.  HENT: Negative for congestion, ear pain, sinus pressure, sinus pain and sore throat.   Eyes: Negative for pain and visual disturbance.  Respiratory: Positive for chest tightness and shortness of breath. Negative for cough.   Cardiovascular: Positive for chest pain. Negative for palpitations.  Gastrointestinal: Negative for abdominal pain, diarrhea, nausea and vomiting.  Genitourinary: Negative for dysuria and hematuria.  Musculoskeletal: Negative for  arthralgias, back pain and myalgias.  Skin: Negative for color change and rash.  Neurological: Negative for seizures, syncope, light-headedness and headaches.  All other systems reviewed and are negative.    Physical Exam Triage Vital Signs ED Triage Vitals  Enc Vitals Group     BP 09/16/19 1217 (!) 151/80     Pulse Rate 09/16/19 1217 97     Resp 09/16/19 1217 18     Temp 09/16/19 1217 98.4 F (36.9 C)     Temp Source 09/16/19 1217 Oral     SpO2 09/16/19 1217 99 %     Weight --      Height --      Head Circumference --      Peak Flow --      Pain Score 09/16/19  1209 4     Pain Loc --      Pain Edu? --      Excl. in GC? --    No data found.  Updated Vital Signs BP (!) 151/80 (BP Location: Left Arm)   Pulse 97   Temp 98.4 F (36.9 C) (Oral)   Resp 18   SpO2 99%   Visual Acuity Right Eye Distance:   Left Eye Distance:   Bilateral Distance:    Right Eye Near:   Left Eye Near:    Bilateral Near:     Physical Exam Vitals and nursing note reviewed.  Constitutional:      General: He is not in acute distress.    Appearance: He is well-developed and normal weight. He is not ill-appearing.  HENT:     Head: Normocephalic and atraumatic.  Eyes:     Conjunctiva/sclera: Conjunctivae normal.     Pupils: Pupils are equal, round, and reactive to light.  Cardiovascular:     Rate and Rhythm: Normal rate and regular rhythm.     Heart sounds: No murmur.  Pulmonary:     Effort: Pulmonary effort is normal. No respiratory distress.     Breath sounds: Normal breath sounds. No decreased breath sounds, wheezing, rhonchi or rales.  Chest:     Chest wall: Tenderness (left of sternum) present.  Abdominal:     Palpations: Abdomen is soft.     Tenderness: There is no abdominal tenderness.  Musculoskeletal:     Cervical back: Neck supple.     Right lower leg: No edema.     Left lower leg: No edema.  Skin:    General: Skin is warm and dry.     Capillary Refill: Capillary refill takes less than 2 seconds.  Neurological:     General: No focal deficit present.     Mental Status: He is alert and oriented to person, place, and time.  Psychiatric:        Mood and Affect: Mood normal.        Behavior: Behavior normal.      UC Treatments / Results  Labs (all labs ordered are listed, but only abnormal results are displayed) Labs Reviewed - No data to display  EKG NSR with non-specific t wave inversion in III.   Radiology No results found.  Procedures Procedures (including critical care time)  Medications Ordered in UC Medications - No data  to display  Initial Impression / Assessment and Plan / UC Course  I have reviewed the triage vital signs and the nursing notes.  Pertinent labs & imaging results that were available during my care of the patient were reviewed by me and considered  in my medical decision making (see chart for details).     #Chest pain - ECG reassuring, reproducible pain is also reassuring for non-cardiac cause. Mild tachy with pleuritic pain could represent pulmonary pathology. Patient is anxious and desires troponins to ease his mind and to confirm that this is likely 2/2 anxiety. We discussed that the Emergency Department is the only place he can have this done, he agrees to go be evaluated there.    Final Clinical Impressions(s) / UC Diagnoses   Final diagnoses:  None   Discharge Instructions   None    ED Prescriptions    None     PDMP not reviewed this encounter.   Purnell Shoemaker, PA-C 09/16/19 1339

## 2019-09-16 NOTE — ED Provider Notes (Signed)
[facility] EMERGENCY DEPARTMENT Provider Note   CSN: [id number] Arrival date & time: 09/16/19  1338     History Chief Complaint  Patient presents with  . Chest Pain    ARAF CLUGSTON is a 59 y.o. male presents today from urgent care for chest pain.  Patient reports that around 7 PM yesterday he developed a sharp/tight sensation to his lower central chest.  He reports pain was initially mild and has remained constant since onset it worsened shortly after onset last night radiating up towards his neck.  He reports he had multiple episodes of belching when pain was at its greatest.  He reports that he took his Prilosec and had moderate amount of relief with this.  He reports that shortly after pain returned but it was a more of a tight sensation he then took one of his wife's Xanax with some relief of the tightness.  Pain has continued today and is no longer radiating up to his neck, throughout the day pain has remained in his central chest.  He was seen at the urgent care and had requested that troponin to be drawn so they sent him to this ER for evaluation.  Of note patient reports increased stress over the last 1 week since his brother who lives of the country passed away from possible lung cancer.  Patient denies any recent illness, fever/chills, headache, vision changes, neck stiffness, cough/shortness of breath, abdominal pain, nausea/vomiting, diaphoresis, extremity swelling/color change, numbness/tingling, weakness or any additional concerns.  HPI     History reviewed. No pertinent past medical history.  There are no problems to display for this patient.   History reviewed. No pertinent surgical history.     No family history on file.  Social History   Tobacco Use  . Smoking status: Never Smoker  . Smokeless tobacco: Never Used  Substance Use Topics  . Alcohol use: No  . Drug use: No    Home Medications Prior to Admission medications   Medication  Sig Start Date End Date Taking? Authorizing Provider  amLODipine (NORVASC) 5 MG tablet Take 5 mg by mouth daily.    [provider]  benzonatate (TESSALON) 200 MG capsule Take 1 capsule (200 mg total) by mouth 3 (three) times daily as needed for cough. 10/05/17   Isa Rankin, MD  cetirizine-pseudoephedrine (ZYRTEC-D) 5-120 MG tablet Take 1 tablet by mouth daily. 05/21/17   Cathie Hoops, Amy V, PA-C  fexofenadine (ALLEGRA) 180 MG tablet Take 1 tablet (180 mg total) by mouth daily. 12/11/11 10/05/17  Reuben Likes, MD  fluticasone (FLONASE) 50 MCG/ACT nasal spray Place 2 sprays into both nostrils daily. 05/21/17   Cathie Hoops, Amy V, PA-C  losartan (COZAAR) 25 MG tablet Take 25 mg by mouth daily.    [provider]  Olopatadine HCl (PATADAY) 0.2 % SOLN Apply 1 drop to eye daily. 12/11/11   Reuben Likes, MD  oseltamivir (TAMIFLU) 75 MG capsule Take 1 capsule (75 mg total) by mouth every 12 (twelve) hours. 10/05/17   Isa Rankin, MD  predniSONE (DELTASONE) 50 MG tablet Take 1 tablet (50 mg total) by mouth daily. 10/05/17   Isa Rankin, MD  tobramycin (TOBREX) 0.3 % ophthalmic solution Place 1 drop into the right eye every 4 (four) hours. 11/28/16   Elvina Sidle, MD    Allergies    Eggs or egg-derived products  Review of Systems   Review of Systems Ten systems are reviewed and are negative  for acute change except as noted in the HPI  Physical Exam Updated Vital Signs BP (!) 134/53 (BP Location: Left Arm)   Pulse 95   Temp 98.2 F (36.8 C) (Oral)   Resp 18   SpO2 98%   Physical Exam Constitutional:      General: He is not in acute distress.    Appearance: Normal appearance. He is well-developed. He is not ill-appearing or diaphoretic.  HENT:     Head: Normocephalic and atraumatic.     Jaw: There is normal jaw occlusion.     Comments: No pain to the temporal or cervical arteries.    Right Ear: External ear normal.     Left Ear: External ear normal.     Nose:  Nose normal.     Mouth/Throat:     Mouth: Mucous membranes are moist.     Pharynx: Oropharynx is clear.  Eyes:     General: Vision grossly intact. Gaze aligned appropriately.     Extraocular Movements: Extraocular movements intact.     Conjunctiva/sclera: Conjunctivae normal.     Pupils: Pupils are equal, round, and reactive to light.     Comments: Visual fields grossly intact bilaterally  Neck:     Trachea: Trachea and phonation normal. No tracheal deviation.  Cardiovascular:     Rate and Rhythm: Normal rate and regular rhythm.     Pulses:          Radial pulses are 2+ on the right side and 2+ on the left side.       Dorsalis pedis pulses are 2+ on the right side and 2+ on the left side.     Heart sounds: Normal heart sounds.  Pulmonary:     Effort: Pulmonary effort is normal. No respiratory distress.     Breath sounds: Normal breath sounds.  Abdominal:     General: There is no distension.     Palpations: Abdomen is soft.     Tenderness: There is no abdominal tenderness. There is no guarding or rebound.  Musculoskeletal:        General: Normal range of motion.     Cervical back: Full passive range of motion without pain, normal range of motion and neck supple.     Right lower leg: No tenderness. No edema.     Left lower leg: No tenderness. No edema.  Skin:    General: Skin is warm and dry.  Neurological:     Mental Status: He is alert.     GCS: GCS eye subscore is 4. GCS verbal subscore is 5. GCS motor subscore is 6.     Comments: Speech is clear and goal oriented, follows commands Major Cranial nerves without deficit, no facial droop Moves extremities without ataxia, coordination intact  Psychiatric:        Behavior: Behavior normal.     ED Results / Procedures / Treatments   Labs (all labs ordered are listed, but only abnormal results are displayed) Labs Reviewed  BASIC METABOLIC PANEL - Abnormal; Notable for the following components:      Result Value   Glucose,  Bld 129 (*)    All other components within normal limits  CBC  LIPASE, BLOOD  HEPATIC FUNCTION PANEL  TROPONIN I (HIGH SENSITIVITY)    EKG EKG Interpretation  Date/Time:  Saturday September 16 2019 13:43:32 EST Ventricular Rate:  100 PR Interval:  156 QRS Duration: 78 QT Interval:  344 QTC Calculation: 443 R Axis:   77 Text  Interpretation: Normal sinus rhythm Normal ECG Confirmed by Margarita Grizzle [phone]) on 09/16/2019 2:40:37 PM   Radiology No results found.  Procedures Procedures (including critical care time)  Medications Ordered in ED Medications  sodium chloride flush (NS) 0.9 % injection 3 mL (3 mLs Intravenous Not Given 09/16/19 1425)  alum & mag hydroxide-simeth (MAALOX/MYLANTA) 200-200-20 MG/5ML suspension 30 mL (30 mLs Oral Given 09/16/19 1455)    And  lidocaine (XYLOCAINE) 2 % viscous mouth solution 15 mL (15 mLs Oral Given 09/16/19 1455)  aspirin chewable tablet 324 mg (324 mg Oral Given 09/16/19 1455)    ED Course  I have reviewed the triage vital signs and the nursing notes.  Pertinent labs & imaging results that were available during my care of the patient were reviewed by me and considered in my medical decision making (see chart for details).    MDM Rules/Calculators/A&P                     59 year old male presents today for ongoing chest pain beginning at 7 PM last night.  He reports pain as burning sharp at first with increased belching that improved after taking Prilosec, pain then changed to a tight sensation which improved using his wife's Xanax.  Pain has been minimal throughout the day today, at urgent care he request that troponins be drawn so he was sent to this ER for evaluation.  On arrival he is well-appearing no acute distress walking around the room.  He reports chest pain is ongoing.  Heart regular rate and rhythm without murmur, lungs clear, neurovascular tact to all 4 extremities without evidence of DVT.  History of chest pain today sounds  atypical in nature.  Possibility of reflux etiology will give GI cocktail.  Patient will need work-up including troponins today.  Low suspicion for dissection at this time based on history and physical examination, will obtain chest x-ray as well, do not feel CT imaging is indicated at this time.  - Initial work-up is reassuring, CBC, LFTs, lipase, troponin all within normal limits.  BMP shows elevated glucose otherwise within normal limits.  Chest x-ray without active disease and EKG reviewed with Dr. Rosalia Hammers does not show acute ischemic changes.  Delta troponin is pending.  On reassessment patient is resting comfortably and in no acute distress. Due to age and heart rate cannot utilize perc criteria, heart rate approximately 100bpm on reassesement, ddimer added. No hypoxia or sob on reassessment.  Patient seen and evaluated by Dr. Rosalia Hammers during this visit.  Care handoff given to Courtni Couture PA-C at shift change, plan of care is to follow-up on remaining studies and reassess. Disposition per oncoming team.  Note: Portions of this report may have been transcribed using voice recognition software. Every effort was made to ensure accuracy; however, inadvertent computerized transcription errors may still be present. Final Clinical Impression(s) / ED Diagnoses Final diagnoses:  None    Rx / DC Orders ED Discharge Orders    None       Elizabeth Palau 09/16/19 1551    Margarita Grizzle, MD 09/21/19 [phone]

## 2019-09-16 NOTE — ED Triage Notes (Signed)
Pt reports last night he started feeling tightness in his chest, pain is wore with deep breath. Pt states having chest discomfort at this moment.

## 2019-09-19 DIAGNOSIS — M25541 Pain in joints of right hand: Secondary | ICD-10-CM | POA: Diagnosis not present

## 2019-09-19 DIAGNOSIS — F410 Panic disorder [episodic paroxysmal anxiety] without agoraphobia: Secondary | ICD-10-CM | POA: Diagnosis not present

## 2019-09-19 DIAGNOSIS — E1169 Type 2 diabetes mellitus with other specified complication: Secondary | ICD-10-CM | POA: Diagnosis not present

## 2019-09-19 DIAGNOSIS — M7989 Other specified soft tissue disorders: Secondary | ICD-10-CM | POA: Diagnosis not present

## 2019-09-19 DIAGNOSIS — Z833 Family history of diabetes mellitus: Secondary | ICD-10-CM | POA: Diagnosis not present

## 2019-09-19 DIAGNOSIS — Z0000 Encounter for general adult medical examination without abnormal findings: Secondary | ICD-10-CM | POA: Diagnosis not present

## 2019-09-19 DIAGNOSIS — K30 Functional dyspepsia: Secondary | ICD-10-CM | POA: Diagnosis not present

## 2019-09-19 DIAGNOSIS — Z125 Encounter for screening for malignant neoplasm of prostate: Secondary | ICD-10-CM | POA: Diagnosis not present

## 2019-09-19 DIAGNOSIS — E782 Mixed hyperlipidemia: Secondary | ICD-10-CM | POA: Diagnosis not present

## 2019-09-19 DIAGNOSIS — I10 Essential (primary) hypertension: Secondary | ICD-10-CM | POA: Diagnosis not present

## 2019-10-19 MED FILL — AMLODIPINE BESYLATE 5 MG TA: 5 | 90 days supply | Qty: 90 | Fill #0

## 2019-10-19 MED FILL — METFORMIN HCL ER 500 MG TB2: 500 | 90 days supply | Qty: 90 | Fill #0

## 2019-10-19 MED FILL — LOSARTAN POTASSIUM 25 MG TA: 25 | 90 days supply | Qty: 90 | Fill #0

## 2019-10-19 MED FILL — ATORVASTATIN 10 MG TABLET: 10 | 90 days supply | Qty: 90 | Fill #0

## 2020-02-05 MED FILL — ATORVASTATIN CALCIUM 10 MG: 10 | 90 days supply | Qty: 90 | Fill #0

## 2020-02-05 MED FILL — METFORMIN HCL ER 500 MG TB2: 500 | 90 days supply | Qty: 90 | Fill #0

## 2020-03-25 DIAGNOSIS — E1169 Type 2 diabetes mellitus with other specified complication: Secondary | ICD-10-CM | POA: Diagnosis not present

## 2020-03-25 DIAGNOSIS — I10 Essential (primary) hypertension: Secondary | ICD-10-CM | POA: Diagnosis not present

## 2020-03-25 DIAGNOSIS — N529 Male erectile dysfunction, unspecified: Secondary | ICD-10-CM | POA: Diagnosis not present

## 2020-03-25 DIAGNOSIS — R809 Proteinuria, unspecified: Secondary | ICD-10-CM | POA: Diagnosis not present

## 2020-03-25 DIAGNOSIS — E782 Mixed hyperlipidemia: Secondary | ICD-10-CM | POA: Diagnosis not present

## 2020-04-01 MED FILL — LOSARTAN POTASSIUM 25 MG TA: 25 | 90 days supply | Qty: 90 | Fill #0

## 2020-04-25 MED FILL — METFORMIN HCL ER 500 MG TB2: 500 | 90 days supply | Qty: 90 | Fill #1

## 2020-04-25 MED FILL — AMLODIPINE BESYLATE 5 MG TA: 5 | 90 days supply | Qty: 90 | Fill #1

## 2020-04-25 MED FILL — ATORVASTATIN CALCIUM 10 MG: 10 | 90 days supply | Qty: 90 | Fill #1

## 2020-07-08 ENCOUNTER — Other Ambulatory Visit (HOSPITAL_COMMUNITY): Payer: Self-pay | Admitting: Family Medicine

## 2020-07-08 MED FILL — ATORVASTATIN CALCIUM 10 MG: 10 | 90 days supply | Qty: 90 | Fill #0

## 2020-07-08 MED FILL — AMLODIPINE 2.5 MG TABLET: 2.5 | 90 days supply | Qty: 90 | Fill #0

## 2020-07-08 MED FILL — METFORMIN HCL ER 500 MG TB2: 500 | 90 days supply | Qty: 180 | Fill #0

## 2020-07-08 MED FILL — LOSARTAN POTASSIUM 25 MG TA: 25 | 90 days supply | Qty: 90 | Fill #0

## 2020-08-26 IMAGING — CT CT ANGIO CHEST
2 of 6 series · 19 of 36 positions shown · IV contrast (omnipaque)
Comparison: None.

CLINICAL DATA: Chest tightness.

EXAM:
CT ANGIOGRAPHY CHEST WITH CONTRAST
TECHNIQUE: Multidetector CT imaging of the chest was performed using the
standard protocol during bolus administration of intravenous
contrast. Multiplanar CT image reconstructions and MIPs were
obtained to evaluate the vascular anatomy.
CONTRAST:  52mL OMNIPAQUE IOHEXOL 350 MG/ML SOLN

[Series 7: pe thins · axial · 0.54mm/px · z∈[-327,-50]mm · 18 of 439 slices shown]
[im 22/439  lung]
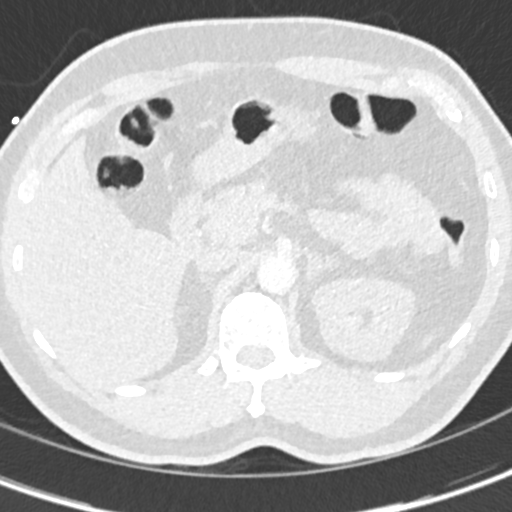
[im 44/439  mediastinal]
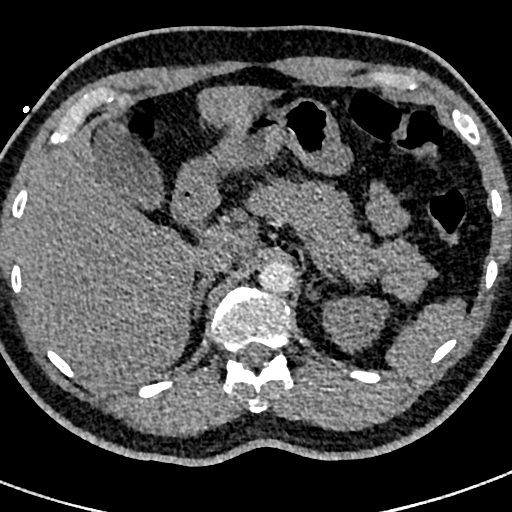
[im 66/439  lung]
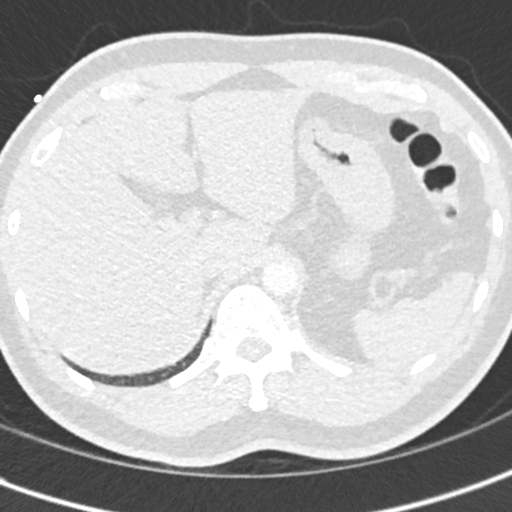
[im 88/439  mediastinal]
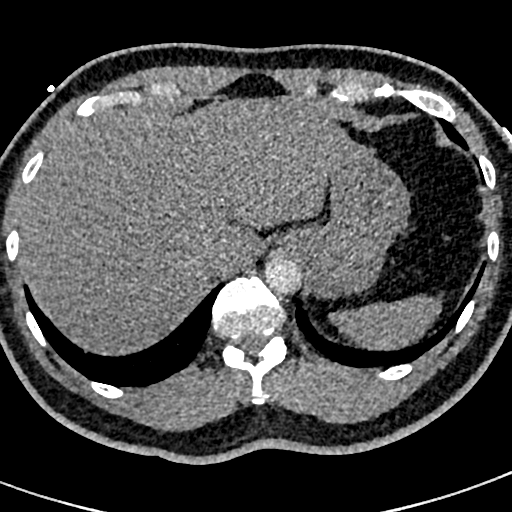
[im 110/439  lung]
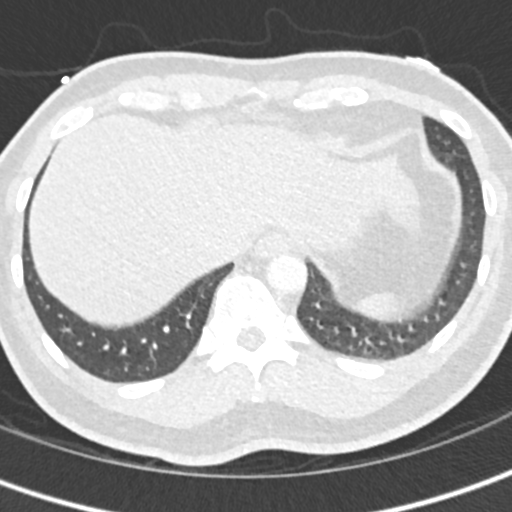
[im 132/439  mediastinal]
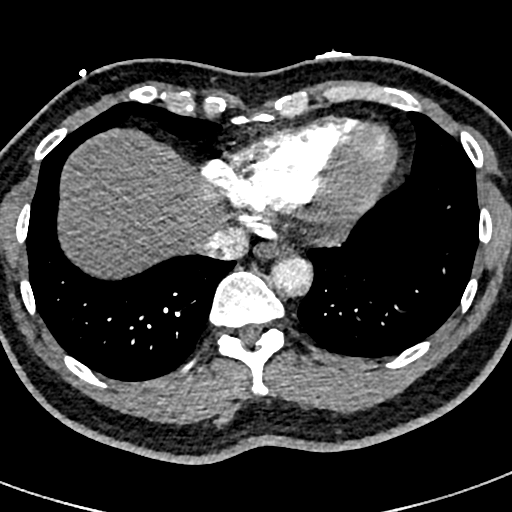
[im 154/439  lung]
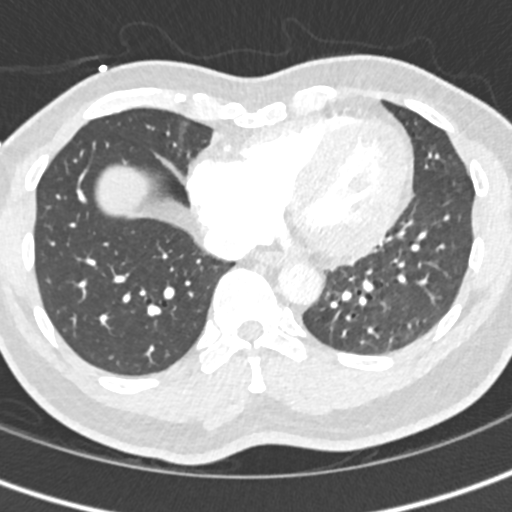
[im 176/439  mediastinal]
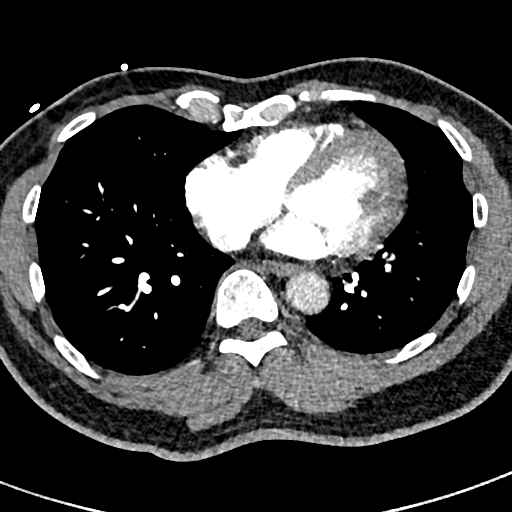
[im 198/439  lung]
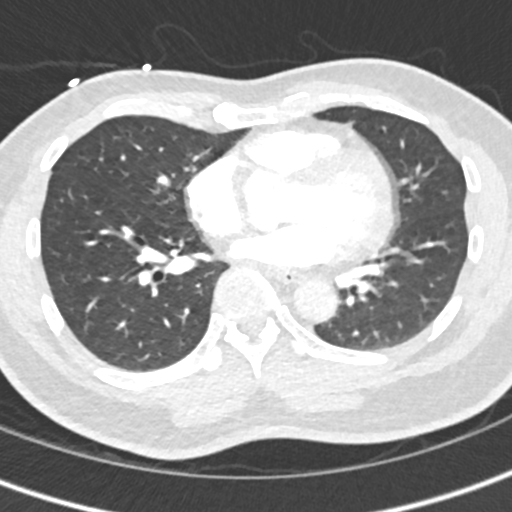
[im 241/439  mediastinal]
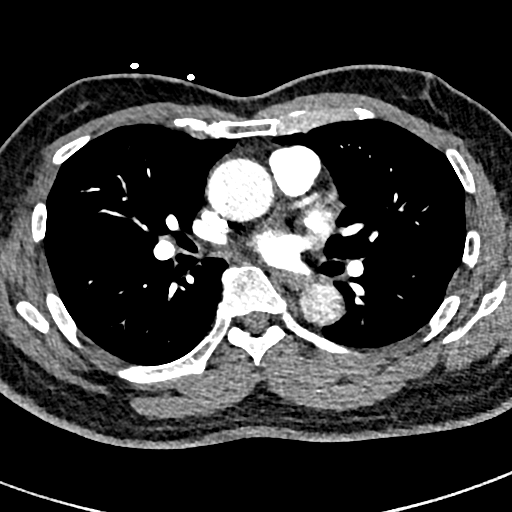
[im 263/439  lung]
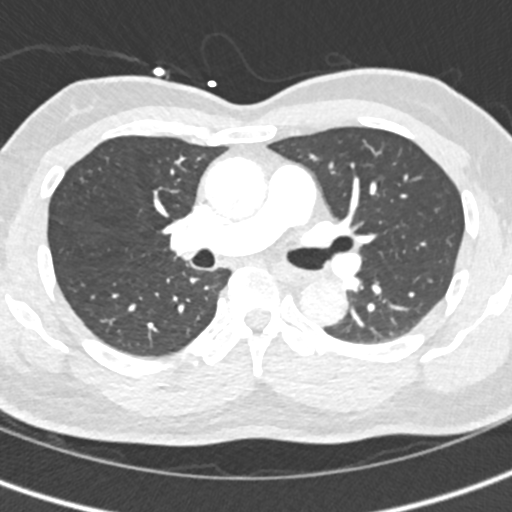
[im 285/439  mediastinal]
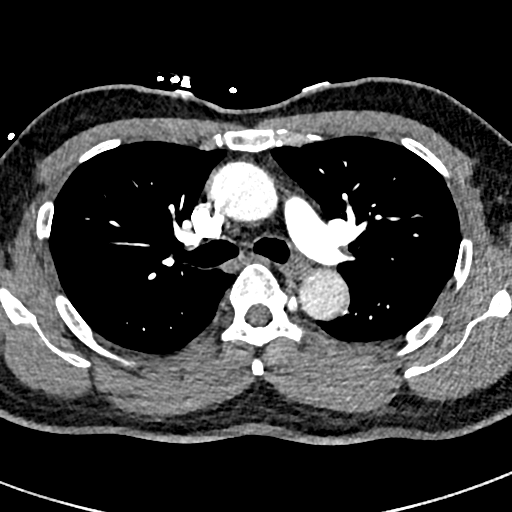
[im 307/439  lung]
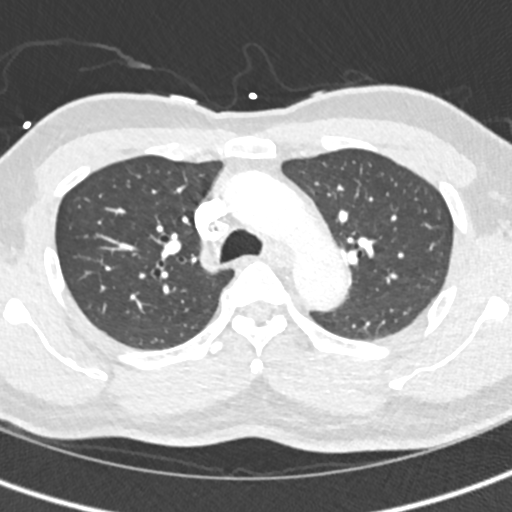
[im 329/439  mediastinal]
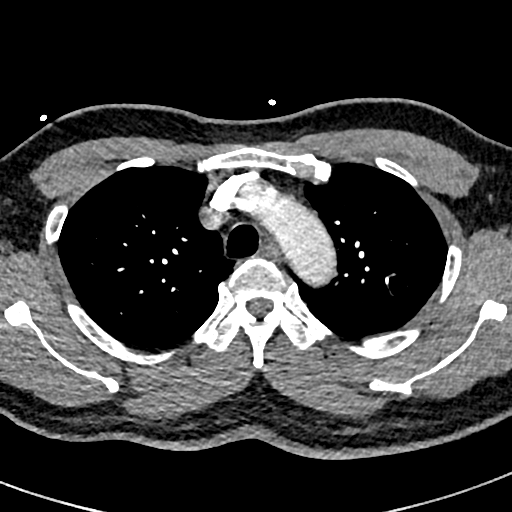
[im 351/439  lung]
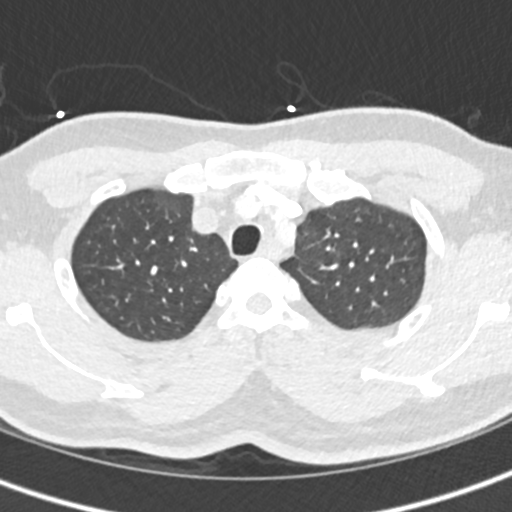
[im 373/439  mediastinal]
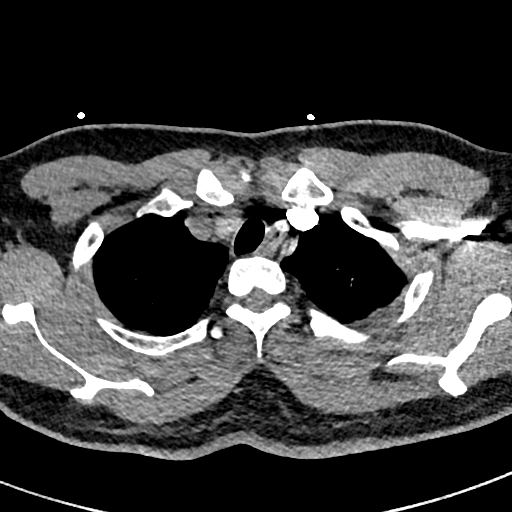
[im 395/439  lung]
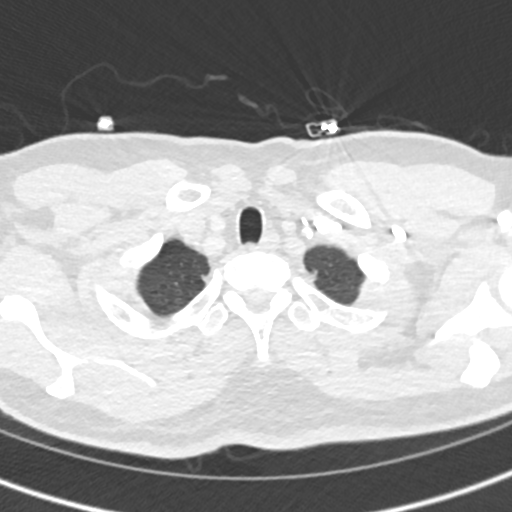
[im 417/439  mediastinal]
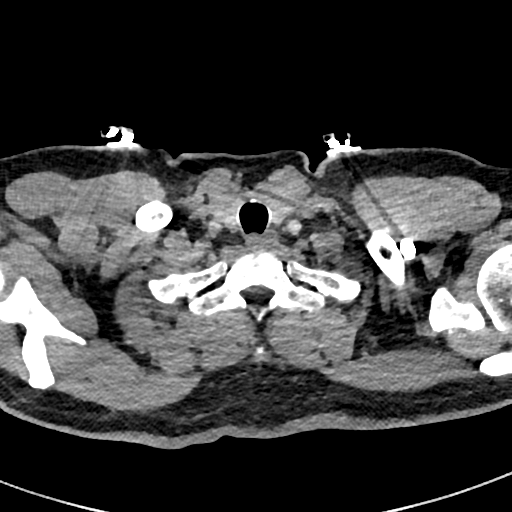

[Series 8: pe 2mm cor · coronal · 0.60mm/px · 1 of 122 slices shown]
[im 61/122  mediastinal]
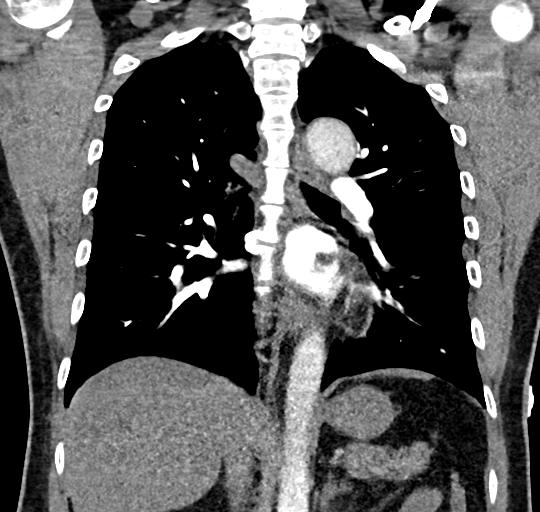

[19 of 36 positions shown; findings below may reference images not displayed]

FINDINGS: Cardiovascular: Satisfactory opacification of the pulmonary arteries
to the segmental level. No evidence of pulmonary embolism. Normal
heart size. No pericardial effusion.

Mediastinum/Nodes: No enlarged mediastinal, hilar, or axillary lymph
nodes. Thyroid gland, trachea, and esophagus demonstrate no
significant findings.

Lungs/Pleura: Lungs are clear. No pleural effusion or pneumothorax.

Upper Abdomen: Multiple subcentimeter gallstones are seen within the
lumen of an otherwise normal-appearing gallbladder.

Musculoskeletal: No chest wall abnormality. No acute or significant
osseous findings.

Review of the MIP images confirms the above findings.
IMPRESSION: 1. No CT evidence of pulmonary embolism.
2. Cholelithiasis.

## 2020-11-19 ENCOUNTER — Other Ambulatory Visit (HOSPITAL_COMMUNITY): Payer: Self-pay

## 2020-11-19 MED ORDER — METFORMIN HCL ER 500 MG PO TB24
ORAL_TABLET | ORAL | 3 refills | Status: AC
  Filled 2020-11-19: qty 180, 90d supply, fill #0

## 2020-11-19 MED ORDER — ATORVASTATIN CALCIUM 10 MG PO TABS
ORAL_TABLET | ORAL | 3 refills | Status: AC
  Filled 2020-11-19: qty 90, 90d supply, fill #0

## 2020-11-19 MED ORDER — AMLODIPINE BESYLATE 2.5 MG PO TABS
ORAL_TABLET | ORAL | 3 refills | Status: AC
  Filled 2020-11-19: qty 90, 90d supply, fill #0

## 2020-11-19 MED ORDER — LOSARTAN POTASSIUM 25 MG PO TABS
ORAL_TABLET | ORAL | 3 refills | Status: AC
  Filled 2020-11-19: qty 30, 30d supply, fill #0

## 2020-11-20 ENCOUNTER — Other Ambulatory Visit (HOSPITAL_COMMUNITY): Payer: Self-pay

## 2021-01-21 ENCOUNTER — Other Ambulatory Visit (HOSPITAL_COMMUNITY): Payer: Self-pay

## 2021-01-21 DIAGNOSIS — R5383 Other fatigue: Secondary | ICD-10-CM | POA: Diagnosis not present

## 2021-01-21 DIAGNOSIS — Z1211 Encounter for screening for malignant neoplasm of colon: Secondary | ICD-10-CM | POA: Diagnosis not present

## 2021-01-21 DIAGNOSIS — E1169 Type 2 diabetes mellitus with other specified complication: Secondary | ICD-10-CM | POA: Diagnosis not present

## 2021-01-21 DIAGNOSIS — N529 Male erectile dysfunction, unspecified: Secondary | ICD-10-CM | POA: Diagnosis not present

## 2021-01-21 DIAGNOSIS — E782 Mixed hyperlipidemia: Secondary | ICD-10-CM | POA: Diagnosis not present

## 2021-01-21 DIAGNOSIS — Z0000 Encounter for general adult medical examination without abnormal findings: Secondary | ICD-10-CM | POA: Diagnosis not present

## 2021-01-21 DIAGNOSIS — Z125 Encounter for screening for malignant neoplasm of prostate: Secondary | ICD-10-CM | POA: Diagnosis not present

## 2021-01-21 DIAGNOSIS — I10 Essential (primary) hypertension: Secondary | ICD-10-CM | POA: Diagnosis not present

## 2021-01-21 DIAGNOSIS — R809 Proteinuria, unspecified: Secondary | ICD-10-CM | POA: Diagnosis not present

## 2021-01-21 MED ORDER — HYDROXYZINE HCL 25 MG PO TABS
25.0000 mg | ORAL_TABLET | Freq: Three times a day (TID) | ORAL | 0 refills | Status: AC | PRN
  Filled 2021-01-21: qty 60, 20d supply, fill #0

## 2021-01-21 MED ORDER — LOSARTAN POTASSIUM 25 MG PO TABS
25.0000 mg | ORAL_TABLET | Freq: Every day | ORAL | 3 refills | Status: AC
  Filled 2021-01-21: qty 90, 90d supply, fill #0

## 2021-01-21 MED ORDER — AMLODIPINE BESYLATE 2.5 MG PO TABS
2.5000 mg | ORAL_TABLET | Freq: Every day | ORAL | 3 refills | Status: AC
  Filled 2021-01-21 – 2021-07-09 (×2): qty 90, 90d supply, fill #0

## 2021-01-21 MED ORDER — ATORVASTATIN CALCIUM 10 MG PO TABS
10.0000 mg | ORAL_TABLET | Freq: Every day | ORAL | 3 refills | Status: AC
  Filled 2021-01-21 – 2021-04-03 (×2): qty 90, 90d supply, fill #0
  Filled 2021-08-26: qty 90, 90d supply, fill #1
  Filled 2021-12-09: qty 90, 90d supply, fill #2

## 2021-01-21 MED ORDER — METFORMIN HCL ER 500 MG PO TB24
500.0000 mg | ORAL_TABLET | Freq: Two times a day (BID) | ORAL | 3 refills | Status: AC
  Filled 2021-01-21 – 2021-03-13 (×2): qty 180, 90d supply, fill #0
  Filled 2021-07-09: qty 180, 90d supply, fill #1

## 2021-01-22 DIAGNOSIS — E782 Mixed hyperlipidemia: Secondary | ICD-10-CM | POA: Diagnosis not present

## 2021-01-22 DIAGNOSIS — N529 Male erectile dysfunction, unspecified: Secondary | ICD-10-CM | POA: Diagnosis not present

## 2021-01-22 DIAGNOSIS — Z0000 Encounter for general adult medical examination without abnormal findings: Secondary | ICD-10-CM | POA: Diagnosis not present

## 2021-01-22 DIAGNOSIS — Z1211 Encounter for screening for malignant neoplasm of colon: Secondary | ICD-10-CM | POA: Diagnosis not present

## 2021-01-22 DIAGNOSIS — Z125 Encounter for screening for malignant neoplasm of prostate: Secondary | ICD-10-CM | POA: Diagnosis not present

## 2021-01-22 DIAGNOSIS — R5383 Other fatigue: Secondary | ICD-10-CM | POA: Diagnosis not present

## 2021-01-22 DIAGNOSIS — R809 Proteinuria, unspecified: Secondary | ICD-10-CM | POA: Diagnosis not present

## 2021-01-22 DIAGNOSIS — I10 Essential (primary) hypertension: Secondary | ICD-10-CM | POA: Diagnosis not present

## 2021-01-22 DIAGNOSIS — E1169 Type 2 diabetes mellitus with other specified complication: Secondary | ICD-10-CM | POA: Diagnosis not present

## 2021-03-13 ENCOUNTER — Other Ambulatory Visit (HOSPITAL_COMMUNITY): Payer: Self-pay

## 2021-04-03 ENCOUNTER — Other Ambulatory Visit (HOSPITAL_COMMUNITY): Payer: Self-pay

## 2021-05-27 ENCOUNTER — Other Ambulatory Visit (HOSPITAL_COMMUNITY): Payer: Self-pay

## 2021-05-27 DIAGNOSIS — M25472 Effusion, left ankle: Secondary | ICD-10-CM | POA: Diagnosis not present

## 2021-05-27 DIAGNOSIS — M25562 Pain in left knee: Secondary | ICD-10-CM | POA: Diagnosis not present

## 2021-05-27 MED ORDER — HYDROCODONE-ACETAMINOPHEN 5-325 MG PO TABS
1.0000 | ORAL_TABLET | Freq: Four times a day (QID) | ORAL | 0 refills | Status: AC | PRN
  Filled 2021-05-27: qty 20, 5d supply, fill #0

## 2021-05-27 MED ORDER — INDOMETHACIN 50 MG PO CAPS
50.0000 mg | ORAL_CAPSULE | Freq: Three times a day (TID) | ORAL | 0 refills | Status: AC
  Filled 2021-05-27: qty 30, 10d supply, fill #0

## 2021-05-30 ENCOUNTER — Other Ambulatory Visit (HOSPITAL_COMMUNITY): Payer: Self-pay

## 2021-07-09 ENCOUNTER — Other Ambulatory Visit (HOSPITAL_COMMUNITY): Payer: Self-pay

## 2021-08-01 DIAGNOSIS — E1169 Type 2 diabetes mellitus with other specified complication: Secondary | ICD-10-CM | POA: Diagnosis not present

## 2021-08-01 DIAGNOSIS — I10 Essential (primary) hypertension: Secondary | ICD-10-CM | POA: Diagnosis not present

## 2021-08-01 DIAGNOSIS — Z7984 Long term (current) use of oral hypoglycemic drugs: Secondary | ICD-10-CM | POA: Diagnosis not present

## 2021-08-01 DIAGNOSIS — E782 Mixed hyperlipidemia: Secondary | ICD-10-CM | POA: Diagnosis not present

## 2021-08-01 DIAGNOSIS — R809 Proteinuria, unspecified: Secondary | ICD-10-CM | POA: Diagnosis not present

## 2021-08-01 DIAGNOSIS — N469 Male infertility, unspecified: Secondary | ICD-10-CM | POA: Diagnosis not present

## 2021-08-05 ENCOUNTER — Other Ambulatory Visit (HOSPITAL_COMMUNITY): Payer: Self-pay

## 2021-08-05 MED ORDER — AMLODIPINE BESYLATE 2.5 MG PO TABS
2.5000 mg | ORAL_TABLET | Freq: Every day | ORAL | 3 refills | Status: DC
  Filled 2021-08-05 – 2022-01-30 (×2): qty 90, 90d supply, fill #0
  Filled 2022-04-24: qty 90, 90d supply, fill #1

## 2021-08-05 MED ORDER — ATORVASTATIN CALCIUM 10 MG PO TABS
10.0000 mg | ORAL_TABLET | Freq: Every day | ORAL | 3 refills | Status: DC
  Filled 2021-08-05 – 2022-03-09 (×2): qty 90, 90d supply, fill #0
  Filled 2022-06-23: qty 90, 90d supply, fill #1

## 2021-08-05 MED ORDER — LOSARTAN POTASSIUM 25 MG PO TABS
25.0000 mg | ORAL_TABLET | Freq: Every day | ORAL | 3 refills | Status: DC
  Filled 2021-08-05 – 2021-08-26 (×2): qty 90, 90d supply, fill #0
  Filled 2021-12-09: qty 90, 90d supply, fill #1
  Filled 2022-03-09: qty 90, 90d supply, fill #2
  Filled 2022-06-23: qty 90, 90d supply, fill #3

## 2021-08-05 MED ORDER — METFORMIN HCL ER 500 MG PO TB24
500.0000 mg | ORAL_TABLET | Freq: Two times a day (BID) | ORAL | 3 refills | Status: AC
  Filled 2021-08-05 – 2021-10-24 (×2): qty 180, 90d supply, fill #0
  Filled 2022-03-09: qty 180, 90d supply, fill #1
  Filled 2022-06-23: qty 180, 90d supply, fill #2

## 2021-08-13 ENCOUNTER — Other Ambulatory Visit (HOSPITAL_COMMUNITY): Payer: Self-pay

## 2021-08-26 ENCOUNTER — Other Ambulatory Visit (HOSPITAL_COMMUNITY): Payer: Self-pay

## 2021-10-24 ENCOUNTER — Other Ambulatory Visit (HOSPITAL_COMMUNITY): Payer: Self-pay

## 2021-12-09 ENCOUNTER — Other Ambulatory Visit (HOSPITAL_COMMUNITY): Payer: Self-pay

## 2022-01-01 DIAGNOSIS — N469 Male infertility, unspecified: Secondary | ICD-10-CM | POA: Diagnosis not present

## 2022-01-26 ENCOUNTER — Other Ambulatory Visit (HOSPITAL_COMMUNITY): Payer: Self-pay

## 2022-01-26 DIAGNOSIS — Z0000 Encounter for general adult medical examination without abnormal findings: Secondary | ICD-10-CM | POA: Diagnosis not present

## 2022-01-26 DIAGNOSIS — I10 Essential (primary) hypertension: Secondary | ICD-10-CM | POA: Diagnosis not present

## 2022-01-26 DIAGNOSIS — N469 Male infertility, unspecified: Secondary | ICD-10-CM | POA: Diagnosis not present

## 2022-01-26 DIAGNOSIS — Z1211 Encounter for screening for malignant neoplasm of colon: Secondary | ICD-10-CM | POA: Diagnosis not present

## 2022-01-26 DIAGNOSIS — Z125 Encounter for screening for malignant neoplasm of prostate: Secondary | ICD-10-CM | POA: Diagnosis not present

## 2022-01-26 DIAGNOSIS — R809 Proteinuria, unspecified: Secondary | ICD-10-CM | POA: Diagnosis not present

## 2022-01-26 DIAGNOSIS — N529 Male erectile dysfunction, unspecified: Secondary | ICD-10-CM | POA: Diagnosis not present

## 2022-01-26 DIAGNOSIS — E782 Mixed hyperlipidemia: Secondary | ICD-10-CM | POA: Diagnosis not present

## 2022-01-26 DIAGNOSIS — E1169 Type 2 diabetes mellitus with other specified complication: Secondary | ICD-10-CM | POA: Diagnosis not present

## 2022-01-26 MED ORDER — LORAZEPAM 0.5 MG PO TABS
0.2500 mg | ORAL_TABLET | Freq: Two times a day (BID) | ORAL | 0 refills | Status: AC | PRN
  Filled 2022-01-26: qty 10, 5d supply, fill #0

## 2022-01-30 ENCOUNTER — Other Ambulatory Visit (HOSPITAL_COMMUNITY): Payer: Self-pay

## 2022-03-09 ENCOUNTER — Other Ambulatory Visit (HOSPITAL_COMMUNITY): Payer: Self-pay

## 2022-04-23 ENCOUNTER — Other Ambulatory Visit (HOSPITAL_COMMUNITY): Payer: Self-pay

## 2022-04-23 DIAGNOSIS — R865 Abnormal microbiological findings in specimens from male genital organs: Secondary | ICD-10-CM | POA: Diagnosis not present

## 2022-04-23 MED ORDER — MELOXICAM 15 MG PO TABS
15.0000 mg | ORAL_TABLET | Freq: Every day | ORAL | 0 refills | Status: AC | PRN
  Filled 2022-04-23: qty 14, 14d supply, fill #0

## 2022-04-23 MED ORDER — DOXYCYCLINE HYCLATE 100 MG PO TABS
100.0000 mg | ORAL_TABLET | Freq: Two times a day (BID) | ORAL | 0 refills | Status: AC
  Filled 2022-04-23: qty 28, 14d supply, fill #0

## 2022-04-24 ENCOUNTER — Other Ambulatory Visit (HOSPITAL_COMMUNITY): Payer: Self-pay

## 2022-05-21 DIAGNOSIS — Z20822 Contact with and (suspected) exposure to covid-19: Secondary | ICD-10-CM | POA: Diagnosis not present

## 2022-05-21 DIAGNOSIS — R509 Fever, unspecified: Secondary | ICD-10-CM | POA: Diagnosis not present

## 2022-05-21 DIAGNOSIS — R197 Diarrhea, unspecified: Secondary | ICD-10-CM | POA: Diagnosis not present

## 2022-05-21 DIAGNOSIS — R110 Nausea: Secondary | ICD-10-CM | POA: Diagnosis not present

## 2022-06-23 ENCOUNTER — Other Ambulatory Visit (HOSPITAL_COMMUNITY): Payer: Self-pay

## 2022-07-30 ENCOUNTER — Other Ambulatory Visit (HOSPITAL_COMMUNITY): Payer: Self-pay

## 2022-07-30 DIAGNOSIS — E782 Mixed hyperlipidemia: Secondary | ICD-10-CM | POA: Diagnosis not present

## 2022-07-30 DIAGNOSIS — I10 Essential (primary) hypertension: Secondary | ICD-10-CM | POA: Diagnosis not present

## 2022-07-30 DIAGNOSIS — Z6823 Body mass index (BMI) 23.0-23.9, adult: Secondary | ICD-10-CM | POA: Diagnosis not present

## 2022-07-30 DIAGNOSIS — E1169 Type 2 diabetes mellitus with other specified complication: Secondary | ICD-10-CM | POA: Diagnosis not present

## 2022-07-30 MED ORDER — METFORMIN HCL ER 500 MG PO TB24
500.0000 mg | ORAL_TABLET | Freq: Two times a day (BID) | ORAL | 3 refills | Status: AC
  Filled 2022-07-30: qty 180, 90d supply, fill #0
  Filled 2022-09-23: qty 60, 30d supply, fill #0
  Filled 2022-10-28: qty 60, 30d supply, fill #1
  Filled 2022-11-20: qty 60, 30d supply, fill #2

## 2022-09-23 ENCOUNTER — Other Ambulatory Visit (HOSPITAL_COMMUNITY): Payer: Self-pay

## 2022-09-23 MED ORDER — AMLODIPINE BESYLATE 2.5 MG PO TABS
2.5000 mg | ORAL_TABLET | Freq: Every day | ORAL | 0 refills | Status: AC
  Filled 2022-09-23: qty 30, 30d supply, fill #0
  Filled 2022-10-28: qty 30, 30d supply, fill #1
  Filled 2022-11-20: qty 30, 30d supply, fill #2

## 2022-09-23 MED ORDER — ATORVASTATIN CALCIUM 10 MG PO TABS
10.0000 mg | ORAL_TABLET | Freq: Every day | ORAL | 0 refills | Status: AC
  Filled 2022-09-23: qty 30, 30d supply, fill #0
  Filled 2022-10-28: qty 30, 30d supply, fill #1
  Filled 2022-11-20: qty 30, 30d supply, fill #2

## 2022-09-23 MED ORDER — LOSARTAN POTASSIUM 25 MG PO TABS
25.0000 mg | ORAL_TABLET | Freq: Every day | ORAL | 0 refills | Status: AC
  Filled 2022-09-23: qty 30, 30d supply, fill #0
  Filled 2022-10-28: qty 30, 30d supply, fill #1
  Filled 2022-11-20: qty 30, 30d supply, fill #2

## 2022-10-28 ENCOUNTER — Other Ambulatory Visit (HOSPITAL_COMMUNITY): Payer: Self-pay

## 2022-11-20 ENCOUNTER — Other Ambulatory Visit (HOSPITAL_COMMUNITY): Payer: Self-pay
